# Patient Record
Sex: Female | Born: 1955 | Race: Black or African American | Hispanic: No | Smoking: Never smoker
Health system: Southern US, Community
[De-identification: ages and names within clinical notes are randomized; demographics above are authoritative.]

## PROBLEM LIST (undated history)

## (undated) DIAGNOSIS — D0510 Intraductal carcinoma in situ of unspecified breast: Secondary | ICD-10-CM

## (undated) DIAGNOSIS — E7800 Pure hypercholesterolemia, unspecified: Secondary | ICD-10-CM

## (undated) HISTORY — PX: TUBAL LIGATION: SHX77

---

## 2015-12-21 ENCOUNTER — Ambulatory Visit
Admission: RE | Admit: 2015-12-21 | Discharge: 2015-12-21 | Disposition: A | Discharge status: Home / Self Care | Payer: Self-pay | Source: Ambulatory Visit | Attending: Oncology | Admitting: Oncology

## 2015-12-21 ENCOUNTER — Other Ambulatory Visit: Payer: Self-pay | Admitting: Oncology

## 2015-12-21 ENCOUNTER — Ambulatory Visit: Payer: Self-pay | Attending: Oncology

## 2015-12-21 VITALS — BP 156/87 | HR 74 | Temp 95.3°F | Resp 18 | Ht 68.11 in | Wt 170.0 lb

## 2015-12-21 DIAGNOSIS — Z0000 Encounter for general adult medical examination without abnormal findings: Secondary | ICD-10-CM

## 2015-12-21 NOTE — Progress Notes (Signed)
Subjective:     Patient ID: Erica Perkins, female   DOB: 01-25-1956, 60 y.o.   MRN: [id number]  HPI   Review of Systems     Objective:   Physical Exam  Pulmonary/Chest: Right breast exhibits no inverted nipple, no mass, no nipple discharge, no skin change and no tenderness. Left breast exhibits no inverted nipple, no mass, no nipple discharge, no skin change and no tenderness. Breasts are symmetrical.  Genitourinary: No labial fusion. There is no rash, tenderness, lesion or injury on the right labia. There is no rash, tenderness, lesion or injury on the left labia. Uterus is not deviated, not enlarged, not fixed and not tender. Cervix exhibits no motion tenderness, no discharge and no friability. No erythema, tenderness or bleeding in the vagina. No foreign body around the vagina. No signs of injury around the vagina. No vaginal discharge found.       Assessment:     60 year old female patient presents for [facility] clinic visit.  Patient screened, and meets BCCCP eligibility.  Patient does not have insurance, Medicare or Medicaid.  Handout given on Affordable Care Act.  Instructed patient on breast self-exam using teach back method.  CBE unremarkable. No mass or lump palpated. Pelvic exam normal.     Plan:  Sent for bilateral screening mammogram.  Specimen collected for pap.

## 2015-12-23 ENCOUNTER — Ambulatory Visit: Payer: Self-pay

## 2015-12-23 LAB — PAP LB AND HPV HIGH-RISK
HPV, HIGH-RISK: NEGATIVE
PAP Smear Comment: 0

## 2015-12-25 ENCOUNTER — Other Ambulatory Visit: Payer: Self-pay | Admitting: *Deleted

## 2015-12-25 DIAGNOSIS — N63 Unspecified lump in unspecified breast: Secondary | ICD-10-CM

## 2015-12-31 NOTE — Progress Notes (Signed)
Pap results normal with negative HPV.  Next pap due in 5 years.  Patient is to be scheduled for additional views for Birads 0 mammogram results from 12/21/15. Orders put in by Tanya Nones RN.  Per radiologist's request patient is scheduled for a 6 month follow-up mammogram and ultrasound of left breast asymmetry, and a right breast ultrasound on July 28, 2016 at 11:20 a.m.  Letter mailed to notify patient of appointment.

## 2016-01-14 ENCOUNTER — Ambulatory Visit
Admission: RE | Admit: 2016-01-14 | Discharge: 2016-01-14 | Disposition: A | Discharge status: Home / Self Care | Payer: Self-pay | Source: Ambulatory Visit | Attending: Oncology | Admitting: Oncology

## 2016-01-14 ENCOUNTER — Other Ambulatory Visit: Payer: Self-pay

## 2016-01-14 ENCOUNTER — Other Ambulatory Visit: Payer: Self-pay | Admitting: Oncology

## 2016-01-14 DIAGNOSIS — N63 Unspecified lump in unspecified breast: Secondary | ICD-10-CM

## 2016-01-14 DIAGNOSIS — R928 Other abnormal and inconclusive findings on diagnostic imaging of breast: Secondary | ICD-10-CM

## 2016-01-26 ENCOUNTER — Other Ambulatory Visit: Payer: Self-pay | Admitting: Oncology

## 2016-01-26 ENCOUNTER — Ambulatory Visit
Admission: RE | Admit: 2016-01-26 | Discharge: 2016-01-26 | Disposition: A | Discharge status: Home / Self Care | Payer: Self-pay | Source: Ambulatory Visit | Attending: Oncology | Admitting: Oncology

## 2016-01-26 DIAGNOSIS — D241 Benign neoplasm of right breast: Secondary | ICD-10-CM | POA: Insufficient documentation

## 2016-01-26 DIAGNOSIS — R928 Other abnormal and inconclusive findings on diagnostic imaging of breast: Secondary | ICD-10-CM

## 2016-01-26 DIAGNOSIS — N63 Unspecified lump in unspecified breast: Secondary | ICD-10-CM

## 2016-01-26 HISTORY — PX: BREAST BIOPSY: SHX20

## 2016-01-27 ENCOUNTER — Telehealth: Payer: Self-pay

## 2016-01-27 LAB — SURGICAL PATHOLOGY

## 2016-01-27 NOTE — Telephone Encounter (Signed)
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  Also gave her negative pap results with negative HPV.  Next pap due in 5 years.  Will schedule 6 month follow-up mammogram.  Patient requests a Tuesday morning appointment.

## 2016-02-09 ENCOUNTER — Other Ambulatory Visit: Payer: Self-pay

## 2016-02-09 DIAGNOSIS — N63 Unspecified lump in unspecified breast: Secondary | ICD-10-CM

## 2016-07-28 ENCOUNTER — Ambulatory Visit: Payer: Self-pay

## 2016-07-28 ENCOUNTER — Other Ambulatory Visit: Payer: Self-pay

## 2016-08-02 ENCOUNTER — Ambulatory Visit
Admission: RE | Admit: 2016-08-02 | Discharge: 2016-08-02 | Disposition: A | Discharge status: Home / Self Care | Payer: Self-pay | Source: Ambulatory Visit | Attending: Oncology | Admitting: Oncology

## 2016-08-02 DIAGNOSIS — N63 Unspecified lump in unspecified breast: Secondary | ICD-10-CM

## 2016-09-05 ENCOUNTER — Encounter: Payer: Self-pay | Admitting: Gastroenterology

## 2016-10-27 ENCOUNTER — Ambulatory Visit (AMBULATORY_SURGERY_CENTER): Payer: Self-pay | Admitting: *Deleted

## 2016-10-27 ENCOUNTER — Encounter (INDEPENDENT_AMBULATORY_CARE_PROVIDER_SITE_OTHER): Payer: Self-pay

## 2016-10-27 DIAGNOSIS — Z1211 Encounter for screening for malignant neoplasm of colon: Secondary | ICD-10-CM

## 2016-10-27 MED ORDER — NA SULFATE-K SULFATE-MG SULF 17.5-3.13-1.6 GM/177ML PO SOLN: ORAL | 0 refills | Status: DC

## 2016-10-27 NOTE — Progress Notes (Signed)
Pt denies allergies to eggs or soy products. Denies difficulty with sedation or anesthesia. Denies any diet or weight loss medications. Denies use of supplemental oxygen.  Emmi instructions given for procedure.  

## 2016-11-10 ENCOUNTER — Ambulatory Visit (AMBULATORY_SURGERY_CENTER): Payer: Self-pay | Admitting: Gastroenterology

## 2016-11-10 ENCOUNTER — Encounter: Payer: Self-pay | Admitting: Gastroenterology

## 2016-11-10 VITALS — BP 134/91 | HR 78 | Temp 97.5°F | Resp 18 | Ht 68.0 in | Wt 169.0 lb

## 2016-11-10 DIAGNOSIS — Z1211 Encounter for screening for malignant neoplasm of colon: Secondary | ICD-10-CM

## 2016-11-10 DIAGNOSIS — Z1212 Encounter for screening for malignant neoplasm of rectum: Secondary | ICD-10-CM

## 2016-11-10 MED ORDER — SODIUM CHLORIDE 0.9 % IV SOLN: 500.0000 mL | INTRAVENOUS | Status: DC

## 2016-11-10 NOTE — Op Note (Signed)
[facility] Patient Name: Erica Perkins Procedure Date: 11/10/2016 10:43 AM MRN: [id number] Endoscopist: Mauri Pole , MD Age: 61 Referring MD:  Date of Birth: 08-11-1956 Gender: Female Account #: [id number] Procedure:                Colonoscopy Indications:              Screening for colorectal malignant neoplasm, This                            is the patient's first colonoscopy Medicines:                Monitored Anesthesia Care Procedure:                Pre-Anesthesia Assessment:                           - Prior to the procedure, a History and Physical                            was performed, and patient medications and                            allergies were reviewed. The patient's tolerance of                            previous anesthesia was also reviewed. The risks                            and benefits of the procedure and the sedation                            options and risks were discussed with the patient.                            All questions were answered, and informed consent                            was obtained. Prior Anticoagulants: The patient has                            taken no previous anticoagulant or antiplatelet                            agents. ASA Grade Assessment: II - A patient with                            mild systemic disease. After reviewing the risks                            and benefits, the patient was deemed in                            satisfactory condition to undergo the procedure.  After obtaining informed consent, the colonoscope                            was passed under direct vision. Throughout the                            procedure, the patient's blood pressure, pulse, and                            oxygen saturations were monitored continuously. The                            Model CF-HQ190L [id number]) scope was introduced                            through the  anus and advanced to the the terminal                            ileum, with identification of the appendiceal                            orifice and IC valve. The colonoscopy was performed                            without difficulty. The patient tolerated the                            procedure well. The quality of the bowel                            preparation was excellent. The terminal ileum,                            ileocecal valve, appendiceal orifice, and rectum                            were photographed. Scope In: 10:48:22 AM Scope Out: 11:00:37 AM Scope Withdrawal Time: 0 hours 8 minutes 50 seconds  Total Procedure Duration: 0 hours 12 minutes 15 seconds  Findings:                 The perianal and digital rectal examinations were                            normal.                           Non-bleeding internal hemorrhoids were found during                            retroflexion. The hemorrhoids were small.                           The exam was otherwise without abnormality.  There was a medium-sized lipoma, 15 mm in diameter,                            in the ascending colon. Complications:            No immediate complications. Estimated Blood Loss:     Estimated blood loss: none. Estimated blood loss:                            none. Impression:               - Non-bleeding internal hemorrhoids.                           - The examination was otherwise normal.                           - Medium-sized lipoma in the ascending colon.                           - No specimens collected. Recommendation:           - Patient has a contact number available for                            emergencies. The signs and symptoms of potential                            delayed complications were discussed with the                            patient. Return to normal activities tomorrow.                            Written discharge instructions were  provided to the                            patient.                           - Resume previous diet.                           - Continue present medications.                           - Repeat colonoscopy in 10 years for screening                            purposes.                           - Return to GI clinic PRN. Mauri Pole, MD 11/10/2016 11:18:52 AM This report has been signed electronically.

## 2016-11-10 NOTE — Progress Notes (Signed)
To recovery, report to Sechler, RN, VSS 

## 2016-11-10 NOTE — Patient Instructions (Signed)
Impression/Recommendations:  Hemorrhoid handout given to patient.  Repeat colonoscopy in 10 years for screening purposes.  YOU HAD AN ENDOSCOPIC PROCEDURE TODAY AT THE [facility]:   Refer to the procedure report that was given to you for any specific questions about what was found during the examination.  If the procedure report does not answer your questions, please call your gastroenterologist to clarify.  If you requested that your care partner not be given the details of your procedure findings, then the procedure report has been included in a sealed envelope for you to review at your convenience later.  YOU SHOULD EXPECT: Some feelings of bloating in the abdomen. Passage of more gas than usual.  Walking can help get rid of the air that was put into your GI tract during the procedure and reduce the bloating. If you had a lower endoscopy (such as a colonoscopy or flexible sigmoidoscopy) you may notice spotting of blood in your stool or on the toilet paper. If you underwent a bowel prep for your procedure, you may not have a normal bowel movement for a few days.  Please Note:  You might notice some irritation and congestion in your nose or some drainage.  This is from the oxygen used during your procedure.  There is no need for concern and it should clear up in a day or so.  SYMPTOMS TO REPORT IMMEDIATELY:   Following lower endoscopy (colonoscopy or flexible sigmoidoscopy):  Excessive amounts of blood in the stool  Significant tenderness or worsening of abdominal pains  Swelling of the abdomen that is new, acute  Fever of 100F or higher  For urgent or emergent issues, a gastroenterologist can be reached at any hour by calling [phone].   DIET:  We do recommend a small meal at first, but then you may proceed to your regular diet.  Drink plenty of fluids but you should avoid alcoholic beverages for 24 hours.  ACTIVITY:  You should plan to take it easy for the rest of  today and you should NOT DRIVE or use heavy machinery until tomorrow (because of the sedation medicines used during the test).    FOLLOW UP: Our staff will call the number listed on your records the next business day following your procedure to check on you and address any questions or concerns that you may have regarding the information given to you following your procedure. If we do not reach you, we will leave a message.  However, if you are feeling well and you are not experiencing any problems, there is no need to return our call.  We will assume that you have returned to your regular daily activities without incident.  If any biopsies were taken you will be contacted by phone or by letter within the next 1-3 weeks.  Please call us at [phone] if you have not heard about the biopsies in 3 weeks.    SIGNATURES/CONFIDENTIALITY: You and/or your care partner have signed paperwork which will be entered into your electronic medical record.  These signatures attest to the fact that that the information above on your After Visit Summary has been reviewed and is understood.  Full responsibility of the confidentiality of this discharge information lies with you and/or your care-partner. 

## 2016-11-11 ENCOUNTER — Telehealth: Payer: Self-pay | Admitting: *Deleted

## 2016-11-11 NOTE — Telephone Encounter (Signed)
  Follow up Call-  Call back number 11/10/2016  Post procedure Call Back phone  # [phone]  Permission to leave phone message Yes     Patient questions:  Do you have a fever, pain , or abdominal swelling? No. Pain Score  0 *  Have you tolerated food without any problems? Yes.    Have you been able to return to your normal activities? Yes.    Do you have any questions about your discharge instructions: Diet   No. Medications  No. Follow up visit  No.  Do you have questions or concerns about your Care? no  Actions: * If pain score is 4 or above: No action needed, pain <4.

## 2017-01-25 ENCOUNTER — Ambulatory Visit: Payer: Self-pay

## 2017-01-30 ENCOUNTER — Ambulatory Visit
Admission: RE | Admit: 2017-01-30 | Discharge: 2017-01-30 | Disposition: A | Discharge status: Home / Self Care | Payer: Self-pay | Source: Ambulatory Visit | Attending: Oncology | Admitting: Oncology

## 2017-01-30 ENCOUNTER — Ambulatory Visit: Payer: Self-pay

## 2017-01-30 ENCOUNTER — Ambulatory Visit: Payer: Self-pay | Attending: Oncology

## 2017-01-30 ENCOUNTER — Encounter (INDEPENDENT_AMBULATORY_CARE_PROVIDER_SITE_OTHER): Payer: Self-pay

## 2017-01-30 VITALS — BP 150/78 | HR 73 | Temp 98.1°F | Ht 67.0 in | Wt 157.3 lb

## 2017-01-30 DIAGNOSIS — N63 Unspecified lump in unspecified breast: Secondary | ICD-10-CM

## 2017-01-30 NOTE — Progress Notes (Signed)
Subjective:     Patient ID: Erica Perkins, female   DOB: [birth year], 61 y.o.   MRN: [id number]  HPI   Review of Systems     Objective:   Physical Exam  Pulmonary/Chest: Right breast exhibits no inverted nipple, no mass, no nipple discharge, no skin change and no tenderness. Left breast exhibits no inverted nipple, no mass, no nipple discharge, no skin change and no tenderness. Breasts are symmetrical.       Assessment:     61 year old patient returns for annual BCCCP screening.  She had a benign right breast biopsy in April 2017.  Following patient's 6 month follow-up mammogram in October 2017, radiologist requested  6 month follow-up diagnostic mammogram, and left breast ultrasound follow-up left breast asymmetry.  Patient screened, and meets BCCCP eligibility.  Patient does not have insurance, Medicare or Medicaid.  Handout given on Affordable Care Act.  Instructed patient on breast self-exam using teach back method.  CBE unremarkable. No mass or lump palpated.    Plan:     Sent for bilateral diagnostic mammogram, and ultrasound.

## 2017-03-10 NOTE — Progress Notes (Signed)
Patient phoned to state she received BCCCP appointment information.  Appointment scheduled 03/14/18 at 12:30.  Patient will need diagnostic mammogram and ultrasound as follow-up for Birads 3 results.  Copy to HSIS.

## 2018-03-14 ENCOUNTER — Ambulatory Visit: Payer: Self-pay | Attending: Oncology

## 2018-03-14 ENCOUNTER — Other Ambulatory Visit: Payer: Self-pay

## 2018-03-14 ENCOUNTER — Encounter (INDEPENDENT_AMBULATORY_CARE_PROVIDER_SITE_OTHER): Payer: Self-pay

## 2018-03-14 ENCOUNTER — Ambulatory Visit
Admission: RE | Admit: 2018-03-14 | Discharge: 2018-03-14 | Disposition: A | Discharge status: Home / Self Care | Payer: Self-pay | Source: Ambulatory Visit | Attending: Oncology | Admitting: Oncology

## 2018-03-14 VITALS — BP 169/91 | HR 73 | Temp 98.6°F | Ht 68.0 in | Wt 150.0 lb

## 2018-03-14 DIAGNOSIS — N63 Unspecified lump in unspecified breast: Secondary | ICD-10-CM

## 2018-03-14 NOTE — Progress Notes (Signed)
  Subjective:     Patient ID: Erica Perkins, female   DOB: April 17, 1956, 62 y.o.   MRN: [id number]  HPI   Review of Systems     Objective:   Physical Exam  Pulmonary/Chest: Right breast exhibits no inverted nipple, no mass, no nipple discharge, no skin change and no tenderness. Left breast exhibits no inverted nipple, no mass, no nipple discharge, no skin change and no tenderness. Breasts are symmetrical.       Assessment:     62 year old patient returns for [place] clinic visit, and follow-up Birads 3 mammogram results.  Patient had a benign biopsy right breast in December 2018, and left breast asymmetry finding.   Patient screened, and meets BCCCP eligibility.  Patient does not have insurance, Medicare or Medicaid.  Handout given on Affordable Care Act.Instructed patient on breast self awareness using teach back method.  Clinical breast exam unremarkable.  No mass or lump palpated.  Patient lives in [place], and needs assist with transportation.  Gas voucher  Given through the [employer].    Plan:     Sent for bilateral diagnostic mammogram, and ultrasound.

## 2018-03-14 NOTE — Progress Notes (Signed)
Letter mailed from [facility] to notify of normal mammogram results.  Patient to return in one year for annual screening.  Copy to HSIS. 

## 2019-05-08 ENCOUNTER — Telehealth: Payer: Self-pay

## 2019-05-08 NOTE — Telephone Encounter (Signed)
NOTES FROM [place] DNP [phone], NOT [facility] Masse

## 2019-05-08 NOTE — Telephone Encounter (Signed)
NOTES FROM Athena Masse PA [phone] SENT REFERRAL TO [place]

## 2019-07-03 ENCOUNTER — Ambulatory Visit: Payer: Self-pay | Admitting: Cardiology

## 2019-08-19 ENCOUNTER — Telehealth: Payer: Self-pay

## 2019-08-19 NOTE — Telephone Encounter (Signed)
Pre-visit BCCCP call for 08/21/2019 appointment. No answer - left msg.

## 2019-08-21 ENCOUNTER — Encounter: Payer: Self-pay | Admitting: *Deleted

## 2019-08-21 ENCOUNTER — Ambulatory Visit: Payer: Self-pay | Attending: Oncology | Admitting: *Deleted

## 2019-08-21 ENCOUNTER — Other Ambulatory Visit: Payer: Self-pay

## 2019-08-21 ENCOUNTER — Ambulatory Visit
Admission: RE | Admit: 2019-08-21 | Discharge: 2019-08-21 | Disposition: A | Discharge status: Home / Self Care | Payer: Self-pay | Source: Ambulatory Visit | Attending: Oncology | Admitting: Oncology

## 2019-08-21 VITALS — BP 144/79 | HR 79 | Temp 97.5°F | Resp 16 | Ht 68.0 in | Wt 142.0 lb

## 2019-08-21 DIAGNOSIS — Z0000 Encounter for general adult medical examination without abnormal findings: Secondary | ICD-10-CM | POA: Insufficient documentation

## 2019-08-21 NOTE — Progress Notes (Signed)
  Subjective:     Patient ID: Erica Perkins, female   DOB: Jul 24, 1956, 63 y.o.   MRN: [id number]  HPI   Review of Systems     Objective:   Physical Exam Chest:     Breasts:        Right: No swelling, bleeding, inverted nipple, mass, nipple discharge, skin change or tenderness.        Left: No swelling, bleeding, inverted nipple, mass, nipple discharge, skin change or tenderness.    Lymphadenopathy:     Upper Body:     Right upper body: No supraclavicular or axillary adenopathy.     Left upper body: No supraclavicular or axillary adenopathy.        Assessment:     63 year old female returns to [facility] for annual screening.  Clinical breast exam reveals an approximate 1 cm hard, mobile superficial like cyst in the left axilla.   Patient states it has been present for about 3 weeks.  States she does get some white pus from it. Encouraged patient to follow up with her primary care provider if it persist or shows signs of infection.  She is agreeable.  Taught self breast awareness.  Last pap on 12/22/15 was negative / negative.  Next pap due in 2022.  Patient has been screened for eligibility.  She does not have any insurance, Medicare or Medicaid.  She also meets financial eligibility.  Hand-out given on the Affordable Care Act. Risk Assessment    Risk Scores      08/21/2019 03/14/2018   Last edited by: Theodore Demark, RN Theodore Demark, RN   5-year risk: 2 % 1.9 %   Lifetime risk: 7.5 % 8 %            Plan:     Screening mammogram ordered.  Will follow up per BCCCP protocol.

## 2019-08-21 NOTE — Patient Instructions (Signed)
Gave patient hand-out, Women Staying Healthy, Active and Well from B[facility]CP, with education on breast health, pap smears, heart and colon health. 

## 2019-08-22 ENCOUNTER — Encounter: Payer: Self-pay | Admitting: *Deleted

## 2019-08-22 NOTE — Progress Notes (Signed)
Letter mailed from the [facility] to inform patient of her normal mammogram results.  Patient is to follow-up with annual screening in one year.  HSIS to [place]. 

## 2019-12-23 ENCOUNTER — Emergency Department
Admission: EM | Admit: 2019-12-23 | Discharge: 2019-12-23 | Disposition: A | Discharge status: Home / Self Care | Payer: Self-pay | Attending: Emergency Medicine | Admitting: Emergency Medicine

## 2019-12-23 ENCOUNTER — Encounter: Payer: Self-pay | Admitting: Emergency Medicine

## 2019-12-23 ENCOUNTER — Other Ambulatory Visit: Payer: Self-pay

## 2019-12-23 DIAGNOSIS — E875 Hyperkalemia: Secondary | ICD-10-CM | POA: Insufficient documentation

## 2019-12-23 DIAGNOSIS — Z711 Person with feared health complaint in whom no diagnosis is made: Secondary | ICD-10-CM | POA: Insufficient documentation

## 2019-12-23 DIAGNOSIS — Z5321 Procedure and treatment not carried out due to patient leaving prior to being seen by health care provider: Secondary | ICD-10-CM | POA: Insufficient documentation

## 2019-12-23 LAB — COMPREHENSIVE METABOLIC PANEL
ALT: 12 U/L (ref 0–44)
AST: 13 U/L — ABNORMAL LOW (ref 15–41)
Albumin: 4.5 g/dL (ref 3.5–5.0)
Alkaline Phosphatase: 61 U/L (ref 38–126)
Anion gap: 8 (ref 5–15)
BUN: 15 mg/dL (ref 8–23)
CO2: 30 mmol/L (ref 22–32)
Calcium: 9.5 mg/dL (ref 8.9–10.3)
Chloride: 103 mmol/L (ref 98–111)
Creatinine, Ser: 0.51 mg/dL (ref 0.44–1.00)
GFR calc Af Amer: >60 mL/min (ref >60)
GFR calc non Af Amer: >60 mL/min (ref >60)
Glucose, Bld: 102 mg/dL — ABNORMAL HIGH (ref 70–99)
Potassium: 4.8 mmol/L (ref 3.5–5.1)
Sodium: 141 mmol/L (ref 135–145)
Total Bilirubin: 0.6 mg/dL (ref 0.3–1.2)
Total Protein: 7.9 g/dL (ref 6.5–8.1)

## 2019-12-23 LAB — CBC WITH DIFFERENTIAL/PLATELET
Abs Immature Granulocytes: 0.02 10*3/uL (ref 0.00–0.07)
Basophils Absolute: 0 10*3/uL (ref 0.0–0.1)
Basophils Relative: 0 %
Eosinophils Absolute: 0.1 10*3/uL (ref 0.0–0.5)
Eosinophils Relative: 1 %
HCT: 37.9 % (ref 36.0–46.0)
Hemoglobin: 11.9 g/dL — ABNORMAL LOW (ref 12.0–15.0)
Immature Granulocytes: 0 %
Lymphocytes Relative: 35 %
Lymphs Abs: 2.5 10*3/uL (ref 0.7–4.0)
MCH: 27 pg (ref 26.0–34.0)
MCHC: 31.4 g/dL (ref 30.0–36.0)
MCV: 86.1 fL (ref 80.0–100.0)
Monocytes Absolute: 0.8 10*3/uL (ref 0.1–1.0)
Monocytes Relative: 11 %
Neutro Abs: 3.9 10*3/uL (ref 1.7–7.7)
Neutrophils Relative %: 53 %
Platelets: 326 10*3/uL (ref 150–400)
RBC: 4.4 MIL/uL (ref 3.87–5.11)
RDW: 14.9 % (ref 11.5–15.5)
WBC: 7.3 10*3/uL (ref 4.0–10.5)
nRBC: 0 % (ref 0.0–0.2)

## 2019-12-23 NOTE — ED Triage Notes (Signed)
Pt reports to ED from PCP for "critical K+" Pt denies chest pain, SHOB, dizziness, weakness, pain  Reports that she feels normal

## 2019-12-23 NOTE — ED Notes (Signed)
No answer when called several times from lobby; called pt on phone and st had already left

## 2019-12-23 NOTE — ED Triage Notes (Addendum)
Pt presents to ED with elevated K+ level after having blood drawn with pcp. Pt was told K+ was 5.9 and to come to ED immediately. Pt came to ED a few hours ago but had to leave and take her family home and just back. Denies any symptoms currently.

## 2019-12-24 ENCOUNTER — Emergency Department
Admission: EM | Admit: 2019-12-24 | Discharge: 2019-12-24 | Disposition: A | Discharge status: Home / Self Care | Payer: Self-pay | Attending: Emergency Medicine | Admitting: Emergency Medicine

## 2019-12-24 DIAGNOSIS — Z0000 Encounter for general adult medical examination without abnormal findings: Secondary | ICD-10-CM

## 2019-12-24 NOTE — Discharge Instructions (Addendum)
As we discussed, it is likely that the abnormal potassium level found at your doctor's office was the result of what is called hemolysis, which presents as a falsely high potassium level.  Fortunately when we rechecked your lab work tonight in the emergency department all of your labs were within normal limits including your potassium level.  Please follow-up with your regular doctor and let him or her know that your potassium was fine upon recheck.  Keep up the good work with your physical fitness!  Return to the emergency department if you develop new or worsening symptoms that concern you.

## 2019-12-24 NOTE — ED Provider Notes (Signed)
[facility] Emergency Department Provider Note  ____________________________________________   First MD Initiated Contact with Patient 12/24/19 [phone]     (approximate)  I have reviewed the triage vital signs and the nursing notes.   HISTORY  Chief Complaint Abnormal Lab    HPI Erica Perkins is a 64 y.o. female who presents for evaluation of possible abnormal lab.  She had a normal visit to her PCP within the last 1 to 2 days for routine maintenance and lab work.  She got a call this morning that told her her potassium was elevated at 5.9 and she needed to emergently go to the emergency department.  She has no symptoms.  She denies chest pain, palpitations, sore throat, shortness of breath, nausea, vomiting, and abdominal pain.  She reports that she is extremely healthy and active, works out 4 hours in the morning and 4 hours in the evening including 500 sit ups each time, has never had any issues with her blood work other than occasional elevated cholesterol, and takes no medications.  She has been urinating normally.  She has no concerns about her health.         History reviewed. No pertinent past medical history.  There are no problems to display for this patient.   Past Surgical History:  Procedure Laterality Date  . BREAST BIOPSY Right 01/26/2016   u/s core/ neg FIBROADENOMA    Prior to Admission medications   Not on File    Allergies Patient has no known allergies.  Family History  Problem Relation Age of Onset  . Breast cancer Sister        35's  . Breast cancer Maternal Aunt   . Colon cancer Neg Hx     Social History Social History   Tobacco Use  . Smoking status: Never Smoker  . Smokeless tobacco: Never Used  Substance Use Topics  . Alcohol use: Not on file    Comment: rarely  . Drug use: No    Review of Systems Constitutional: No fever/chills Eyes: No visual changes. ENT: No sore throat. Cardiovascular:  Denies chest pain.  Denies palpitations. Respiratory: Denies shortness of breath. Gastrointestinal: No abdominal pain.  No nausea, no vomiting.  No diarrhea.  No constipation. Genitourinary: Negative for dysuria. Neurological: Negative for headaches, focal weakness or numbness.   ____________________________________________   PHYSICAL EXAM:  VITAL SIGNS: ED Triage Vitals  Enc Vitals Group     BP 12/23/19 2227 (!) 170/74     Pulse Rate 12/23/19 2227 80     Resp 12/23/19 2227 18     Temp 12/23/19 2227 98.5 F (36.9 C)     Temp Source 12/23/19 2227 Oral     SpO2 12/23/19 2227 100 %     Weight 12/23/19 2230 69.4 kg (153 lb)     Height 12/23/19 2230 1.727 m (5\' 8" )     Head Circumference --      Peak Flow --      Pain Score 12/23/19 2230 0     Pain Loc --      Pain Edu? --      Excl. in [place]? --     Constitutional: Alert and oriented.  Patient appears quite healthy and active and in no distress. Eyes: Conjunctivae are normal.  Head: Atraumatic. Mouth/Throat: Patient is wearing a mask. Neck: No stridor.  No meningeal signs.   Cardiovascular: Normal rate, regular rhythm. Good peripheral circulation.  Respiratory: Normal respiratory effort.  No retractions. Musculoskeletal:  No lower extremity tenderness nor edema. No gross deformities of extremities. Neurologic:  Normal speech and language. No gross focal neurologic deficits are appreciated.  Skin:  Skin is warm, dry and intact. Psychiatric: Mood and affect are normal. Speech and behavior are normal.  ____________________________________________   LABS (all labs ordered are listed, but only abnormal results are displayed)  Labs Reviewed - No data to display ____________________________________________  EKG  No indication for emergent EKG ____________________________________________  RADIOLOGY I, Erica Perkins, personally viewed and evaluated these images (plain radiographs) as part of my medical decision making, as well  as reviewing the written report by the radiologist.  ED MD interpretation: No indication for emergent imaging  Official radiology report(s): No results found.  ____________________________________________   PROCEDURES   Procedure(s) performed (including Critical Care):  Procedures   ____________________________________________   INITIAL IMPRESSION / MDM / [place] / ED COURSE  As part of my medical decision making, I reviewed the following data within the [facility] notes reviewed and incorporated, Labs reviewed , Old chart reviewed and Notes from prior ED visits   The patient's potassium was elevated at her PCPs office but I suspect this was due to hemolysis.  We will recheck lab work tonight and all of her labs were within normal limits including a potassium of 4.8, normal CBC, otherwise normal CMP.  She is asymptomatic and very healthy and active.  I explained to the patient about the probability of hemolysis and falsely elevated potassium.  She is very comfortable with going home and following up as an outpatient.  I gave my usual and customary return precautions.          ____________________________________________  FINAL CLINICAL IMPRESSION(S) / ED DIAGNOSES  Final diagnoses:  Normal exam     MEDICATIONS GIVEN DURING THIS VISIT:  Medications - No data to display   ED Discharge Orders    None      *Please note:  Erica Perkins was evaluated in Emergency Department on 12/24/2019 for the symptoms described in the history of present illness. She was evaluated in the context of the global COVID-19 pandemic, which necessitated consideration that the patient might be at risk for infection with the SARS-CoV-2 virus that causes COVID-19. Institutional protocols and algorithms that pertain to the evaluation of patients at risk for COVID-19 are in a state of rapid change based on information released by regulatory bodies  including the CDC and federal and state organizations. These policies and algorithms were followed during the patient's care in the ED.  Some ED evaluations and interventions may be delayed as a result of limited staffing during the pandemic.*  Note:  This document was prepared using Dragon voice recognition software and may include unintentional dictation errors.   Erica Kehr, MD 12/24/19 [phone]

## 2020-07-15 ENCOUNTER — Other Ambulatory Visit: Payer: Self-pay | Admitting: Family Medicine

## 2020-07-15 ENCOUNTER — Other Ambulatory Visit: Payer: Self-pay | Admitting: Internal Medicine

## 2020-07-15 DIAGNOSIS — Z1231 Encounter for screening mammogram for malignant neoplasm of breast: Secondary | ICD-10-CM

## 2020-08-20 ENCOUNTER — Other Ambulatory Visit: Payer: Self-pay

## 2020-08-20 ENCOUNTER — Ambulatory Visit
Admission: RE | Admit: 2020-08-20 | Discharge: 2020-08-20 | Disposition: A | Discharge status: Home / Self Care | Payer: 59 | Source: Ambulatory Visit | Attending: Internal Medicine | Admitting: Internal Medicine

## 2020-08-20 DIAGNOSIS — Z1231 Encounter for screening mammogram for malignant neoplasm of breast: Secondary | ICD-10-CM

## 2020-08-31 ENCOUNTER — Other Ambulatory Visit: Payer: Self-pay | Admitting: Internal Medicine

## 2020-08-31 DIAGNOSIS — R921 Mammographic calcification found on diagnostic imaging of breast: Secondary | ICD-10-CM

## 2020-08-31 DIAGNOSIS — R928 Other abnormal and inconclusive findings on diagnostic imaging of breast: Secondary | ICD-10-CM

## 2020-09-14 ENCOUNTER — Ambulatory Visit
Admission: RE | Admit: 2020-09-14 | Discharge: 2020-09-14 | Disposition: A | Discharge status: Home / Self Care | Payer: 59 | Source: Ambulatory Visit | Attending: Internal Medicine | Admitting: Internal Medicine

## 2020-09-14 ENCOUNTER — Other Ambulatory Visit: Payer: Self-pay

## 2020-09-14 DIAGNOSIS — R921 Mammographic calcification found on diagnostic imaging of breast: Secondary | ICD-10-CM | POA: Diagnosis present

## 2020-09-14 DIAGNOSIS — R928 Other abnormal and inconclusive findings on diagnostic imaging of breast: Secondary | ICD-10-CM | POA: Diagnosis present

## 2020-09-18 ENCOUNTER — Other Ambulatory Visit: Payer: Self-pay | Admitting: Internal Medicine

## 2020-09-18 DIAGNOSIS — R921 Mammographic calcification found on diagnostic imaging of breast: Secondary | ICD-10-CM

## 2020-09-18 DIAGNOSIS — R928 Other abnormal and inconclusive findings on diagnostic imaging of breast: Secondary | ICD-10-CM

## 2021-03-18 ENCOUNTER — Inpatient Hospital Stay: Admission: RE | Admit: 2021-03-18 | Payer: 59 | Source: Ambulatory Visit

## 2021-04-01 ENCOUNTER — Other Ambulatory Visit: Payer: Self-pay

## 2021-04-01 ENCOUNTER — Ambulatory Visit
Admission: RE | Admit: 2021-04-01 | Discharge: 2021-04-01 | Disposition: A | Discharge status: Home / Self Care | Payer: 59 | Source: Ambulatory Visit | Attending: Internal Medicine | Admitting: Internal Medicine

## 2021-04-01 DIAGNOSIS — R928 Other abnormal and inconclusive findings on diagnostic imaging of breast: Secondary | ICD-10-CM | POA: Diagnosis not present

## 2021-04-01 DIAGNOSIS — R921 Mammographic calcification found on diagnostic imaging of breast: Secondary | ICD-10-CM | POA: Insufficient documentation

## 2021-08-31 ENCOUNTER — Other Ambulatory Visit: Payer: Self-pay | Admitting: Internal Medicine

## 2021-08-31 DIAGNOSIS — R921 Mammographic calcification found on diagnostic imaging of breast: Secondary | ICD-10-CM

## 2021-11-23 ENCOUNTER — Ambulatory Visit
Admission: RE | Admit: 2021-11-23 | Discharge: 2021-11-23 | Disposition: A | Discharge status: Home / Self Care | Payer: 59 | Source: Ambulatory Visit | Attending: Internal Medicine | Admitting: Internal Medicine

## 2021-11-23 ENCOUNTER — Other Ambulatory Visit: Payer: Self-pay

## 2021-11-23 DIAGNOSIS — R921 Mammographic calcification found on diagnostic imaging of breast: Secondary | ICD-10-CM | POA: Diagnosis not present

## 2022-03-18 IMAGING — MG DIGITAL DIAGNOSTIC BILAT W/ TOMO W/ CAD
8 of 12 series · 9 of 32 positions shown · non-contrast
Comparison: Are films

CLINICAL DATA: Short-term follow-up left breast calcifications

EXAM:
DIGITAL DIAGNOSTIC BILATERAL MAMMOGRAM WITH TOMOSYNTHESIS AND CAD
TECHNIQUE: Bilateral digital diagnostic mammography and breast tomosynthesis
was performed. The images were evaluated with computer-aided
detection.

[L MLO (1 of 2)]
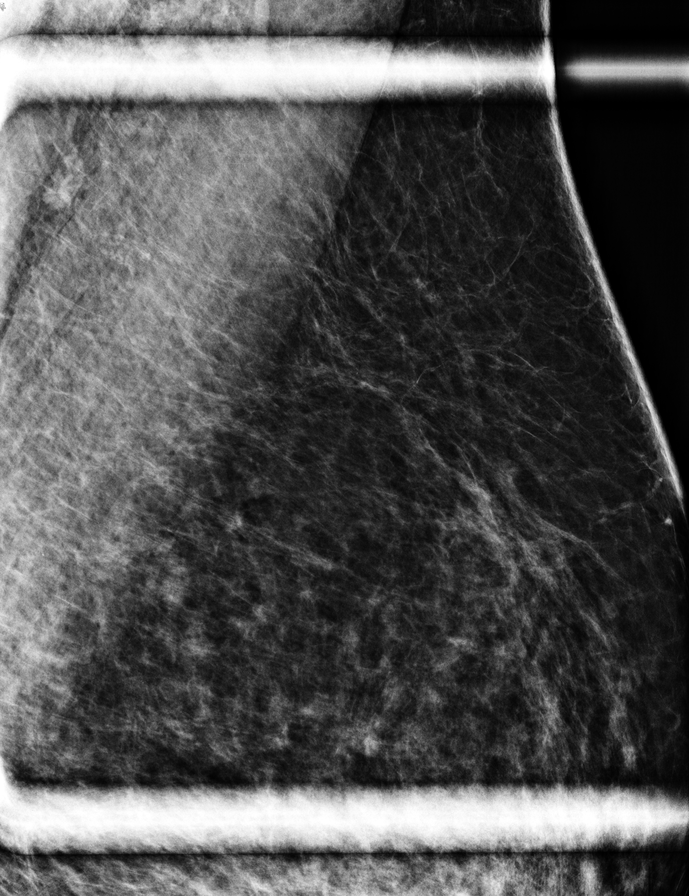

[L MLO (2 of 2)]
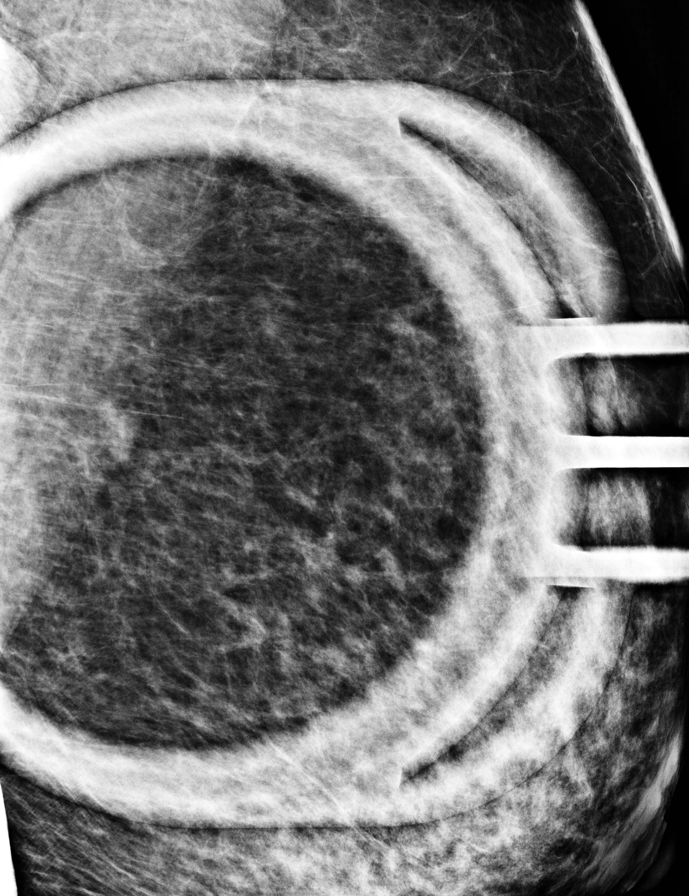

[L MLO synth-2D (1 of 2)]
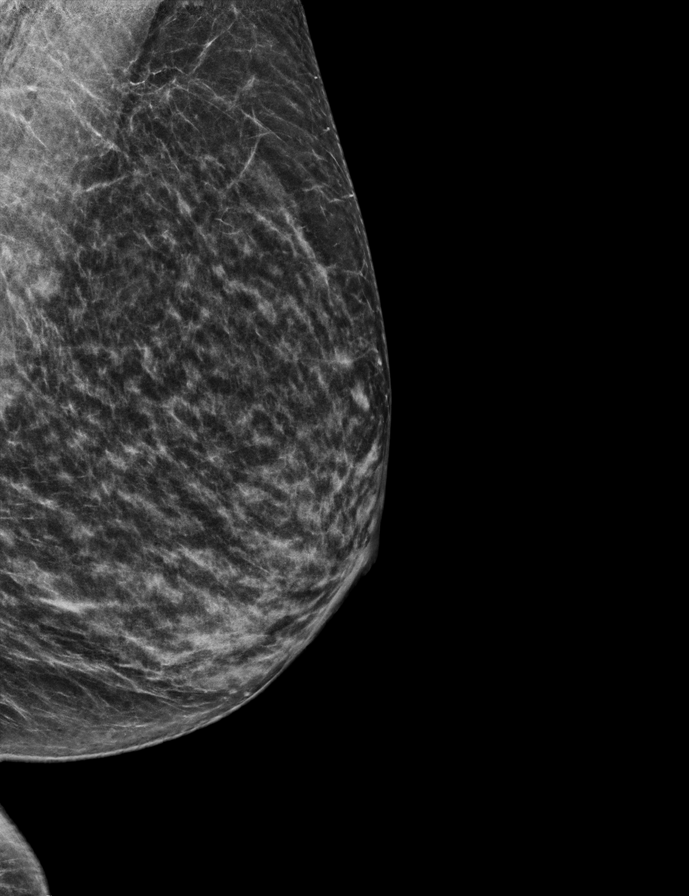

[R CC synth-2D]
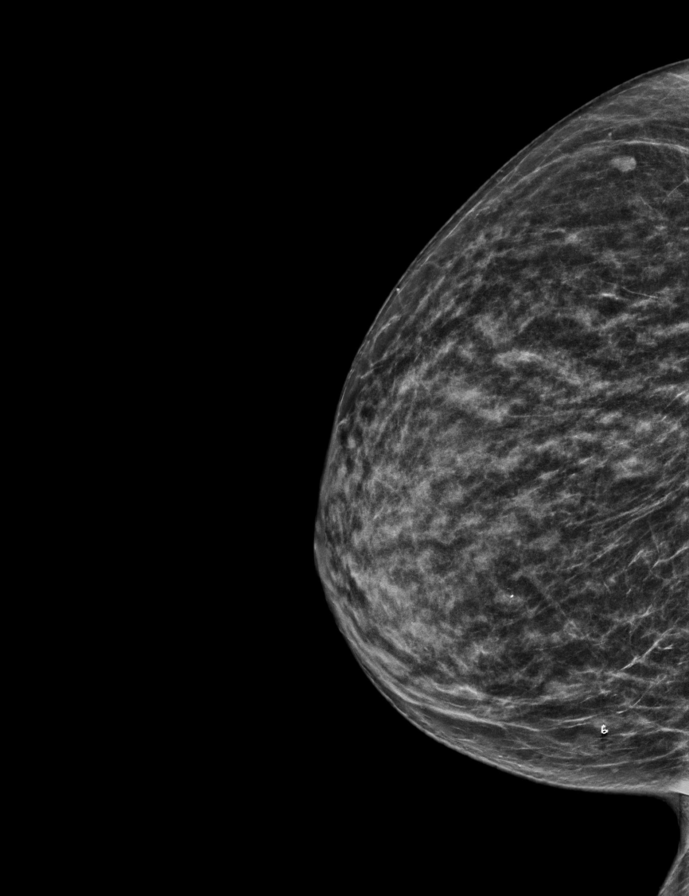

[L MLO synth-2D (2 of 2)]
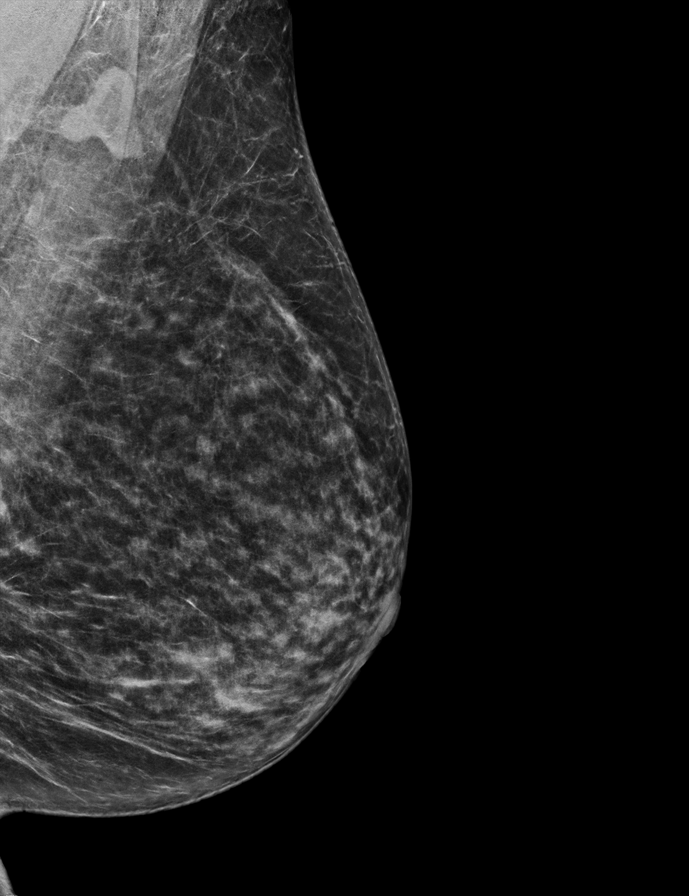

[R MLO synth-2D]
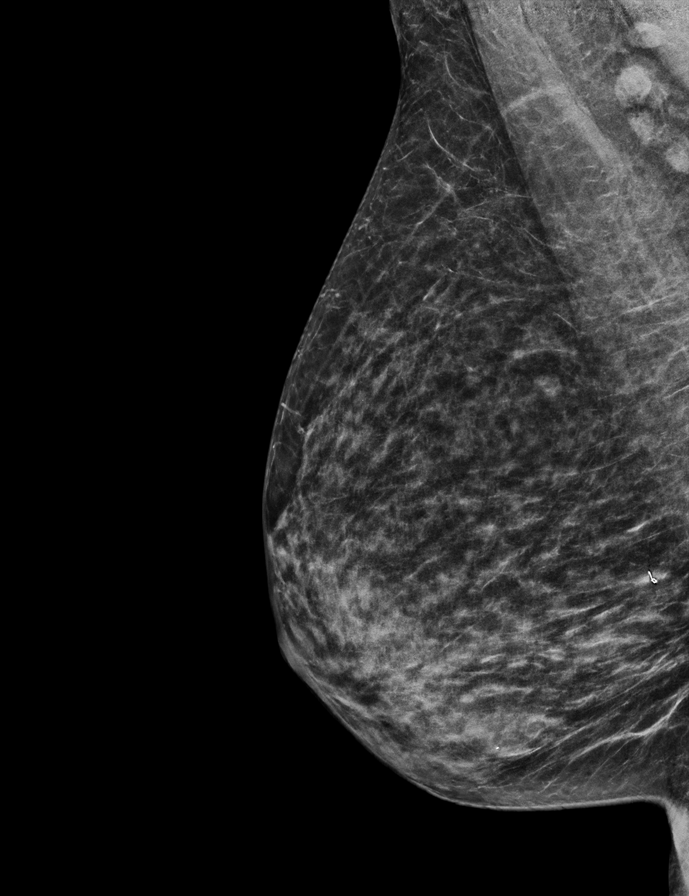

[L CC synth-2D]
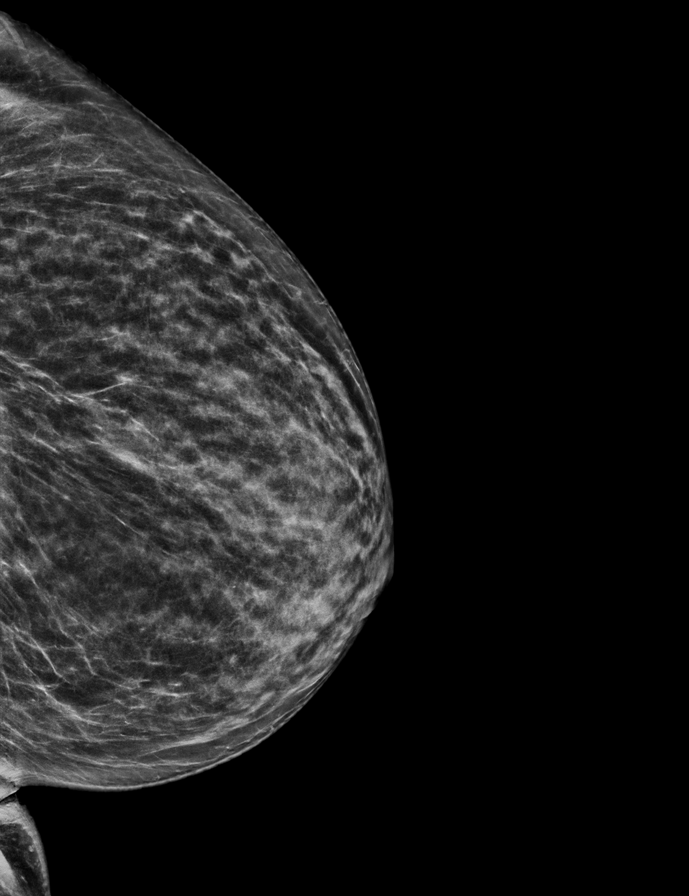

[L MLO tomo · 2 of 52 frames shown]
[frame 17/52]
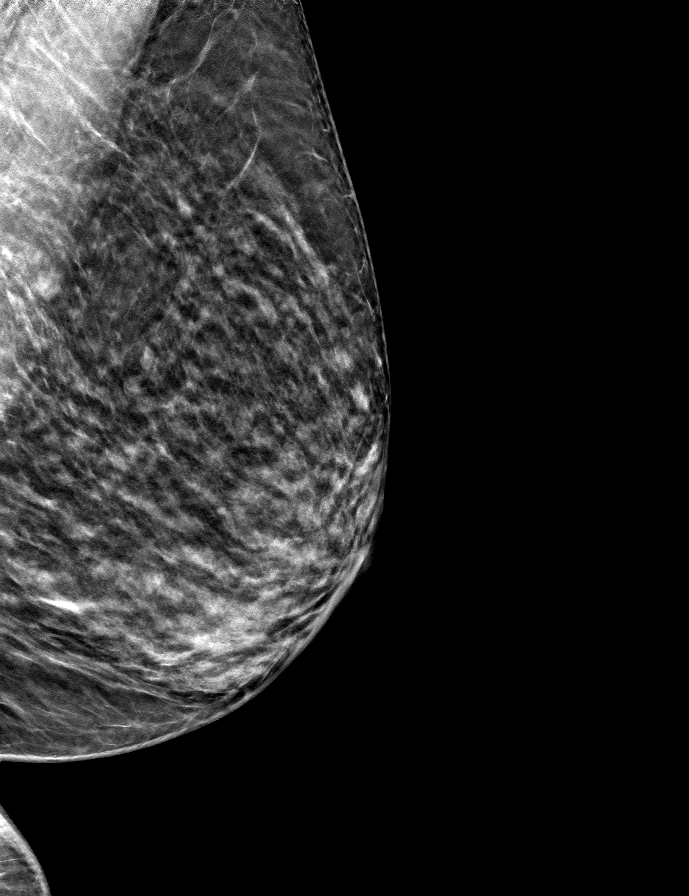
[frame 27/52]
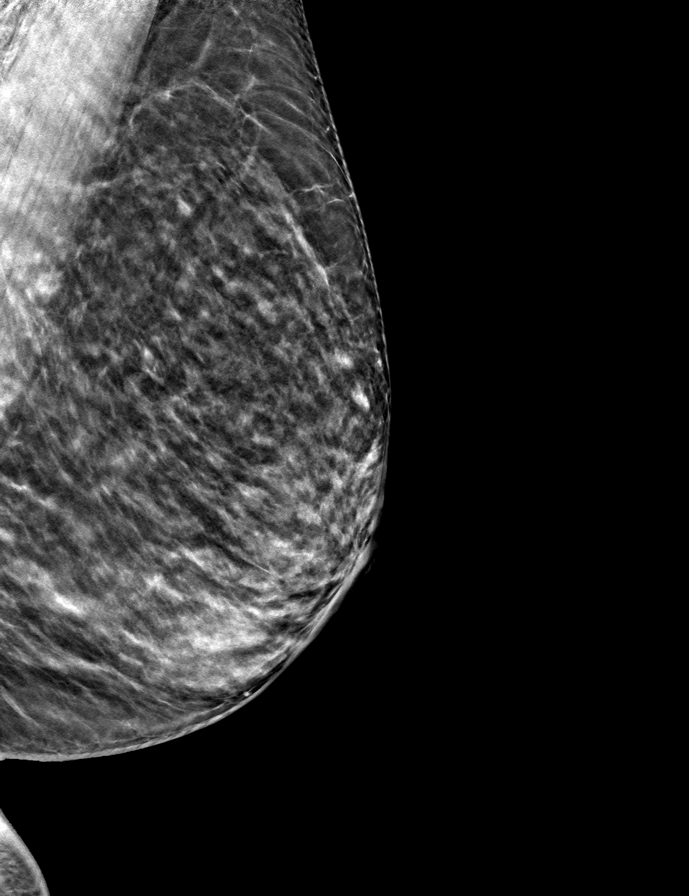

[9 of 32 positions shown; findings below may reference images not displayed]

ACR Breast Density Category c: The breast tissue is heterogeneously
dense, which may obscure small masses.
FINDINGS: Cc and MLO views of bilateral breasts, spot magnification MLO views
of the left breast are submitted. Single coarse benign appearing
calcification is identified in the posterior upper left breast. No
suspicious microcalcifications are noted. No suspicious
abnormalities identified bilaterally.
IMPRESSION: Benign findings.

RECOMMENDATION:
Routine screening mammogram in 1 year.

I have discussed the findings and recommendations with the patient.
If applicable, a reminder letter will be sent to the patient
regarding the next appointment.

BI-RADS CATEGORY  2: Benign.

## 2022-12-15 ENCOUNTER — Other Ambulatory Visit: Payer: Self-pay | Admitting: Internal Medicine

## 2022-12-15 DIAGNOSIS — Z1231 Encounter for screening mammogram for malignant neoplasm of breast: Secondary | ICD-10-CM

## 2022-12-15 DIAGNOSIS — Z780 Asymptomatic menopausal state: Secondary | ICD-10-CM

## 2023-02-07 ENCOUNTER — Ambulatory Visit
Admission: RE | Admit: 2023-02-07 | Discharge: 2023-02-07 | Disposition: A | Discharge status: Home / Self Care | Payer: 59 | Source: Ambulatory Visit | Attending: Internal Medicine | Admitting: Internal Medicine

## 2023-02-07 DIAGNOSIS — Z1231 Encounter for screening mammogram for malignant neoplasm of breast: Secondary | ICD-10-CM | POA: Insufficient documentation

## 2023-02-10 ENCOUNTER — Other Ambulatory Visit: Payer: Self-pay | Admitting: Internal Medicine

## 2023-02-10 DIAGNOSIS — R928 Other abnormal and inconclusive findings on diagnostic imaging of breast: Secondary | ICD-10-CM

## 2023-02-10 DIAGNOSIS — R921 Mammographic calcification found on diagnostic imaging of breast: Secondary | ICD-10-CM

## 2023-03-21 ENCOUNTER — Ambulatory Visit
Admission: RE | Admit: 2023-03-21 | Discharge: 2023-03-21 | Disposition: A | Discharge status: Home / Self Care | Payer: 59 | Source: Ambulatory Visit | Attending: Internal Medicine | Admitting: Internal Medicine

## 2023-03-21 DIAGNOSIS — R921 Mammographic calcification found on diagnostic imaging of breast: Secondary | ICD-10-CM | POA: Diagnosis present

## 2023-03-21 DIAGNOSIS — R928 Other abnormal and inconclusive findings on diagnostic imaging of breast: Secondary | ICD-10-CM | POA: Insufficient documentation

## 2023-03-29 ENCOUNTER — Encounter: Payer: Self-pay | Admitting: Infectious Diseases

## 2023-03-31 ENCOUNTER — Other Ambulatory Visit: Payer: Self-pay | Admitting: Infectious Diseases

## 2023-03-31 ENCOUNTER — Other Ambulatory Visit: Payer: Self-pay | Admitting: Internal Medicine

## 2023-03-31 DIAGNOSIS — R928 Other abnormal and inconclusive findings on diagnostic imaging of breast: Secondary | ICD-10-CM

## 2023-03-31 DIAGNOSIS — R921 Mammographic calcification found on diagnostic imaging of breast: Secondary | ICD-10-CM

## 2023-04-25 ENCOUNTER — Ambulatory Visit
Admission: RE | Admit: 2023-04-25 | Discharge: 2023-04-25 | Disposition: A | Discharge status: Home / Self Care | Payer: 59 | Source: Ambulatory Visit | Attending: Internal Medicine | Admitting: Internal Medicine

## 2023-04-25 DIAGNOSIS — R928 Other abnormal and inconclusive findings on diagnostic imaging of breast: Secondary | ICD-10-CM | POA: Insufficient documentation

## 2023-04-25 DIAGNOSIS — R921 Mammographic calcification found on diagnostic imaging of breast: Secondary | ICD-10-CM

## 2023-04-25 HISTORY — PX: BREAST BIOPSY: SHX20

## 2023-04-25 MED ORDER — LIDOCAINE-EPINEPHRINE 1 %-1:100000 IJ SOLN
20.0000 mL | Freq: Once | INTRAMUSCULAR | Status: AC
  Administered 2023-04-25: 20 mL
  Filled 2023-04-25: qty 20

## 2023-04-25 MED ORDER — LIDOCAINE 1 % OPTIME INJ - NO CHARGE
5.0000 mL | Freq: Once | INTRAMUSCULAR | Status: AC
  Administered 2023-04-25: 5 mL
  Filled 2023-04-25: qty 6

## 2023-04-26 ENCOUNTER — Encounter: Payer: Self-pay | Admitting: *Deleted

## 2023-04-26 NOTE — Progress Notes (Unsigned)
Received referral for newly diagnosed breast cancer from [facility].  Navigation initiated.  Spoke with daughter Maureen to set up appointments.   She will see both med onc and surgeon tomorrow, Dr. Agrawal at 2:00 and Dr. Sakai at 10:15. 

## 2023-04-27 ENCOUNTER — Encounter: Payer: Self-pay | Admitting: *Deleted

## 2023-04-27 NOTE — Progress Notes (Signed)
Patient has spoke to Dr. Kerby Nora and she is declining additional biopsy at this time.   I called patient to discuss getting her set up with medical oncology and surgeon.   She does not want to set that up at this time.  She said she is working on getting her slides and medical records and then will call me to discuss the consultations.  I provided my contact information and also let her know she could go through her PCP Dr. Nemiah Commander for the referrals as well.

## 2023-05-01 ENCOUNTER — Other Ambulatory Visit: Payer: Self-pay | Admitting: Internal Medicine

## 2023-05-01 DIAGNOSIS — R921 Mammographic calcification found on diagnostic imaging of breast: Secondary | ICD-10-CM

## 2023-05-01 DIAGNOSIS — R928 Other abnormal and inconclusive findings on diagnostic imaging of breast: Secondary | ICD-10-CM

## 2023-05-09 ENCOUNTER — Encounter: Payer: Self-pay | Admitting: *Deleted

## 2023-05-09 DIAGNOSIS — D0512 Intraductal carcinoma in situ of left breast: Secondary | ICD-10-CM

## 2023-05-09 NOTE — Progress Notes (Signed)
Erica Perkins would like to receive care at [facility] long, referral placed for medical oncology.

## 2023-05-10 ENCOUNTER — Telehealth: Payer: Self-pay | Admitting: Hematology and Oncology

## 2023-05-10 NOTE — Telephone Encounter (Signed)
Spoke to patient to confirm upcoming morning [facility] clinic appointment on 8/14, paperwork will be sent via mail and e-mail.   Gave location and time, also informed patient that the surgeon's office would be calling as well to get information from them similar to the packet that they will be receiving so make sure to do both.  Reminded patient that all providers will be coming to the clinic to see them HERE and if they had any questions to not hesitate to reach back out to myself or their navigators.

## 2023-05-15 ENCOUNTER — Telehealth: Payer: Self-pay | Admitting: Hematology and Oncology

## 2023-05-15 NOTE — Telephone Encounter (Signed)
Spoke to patient to see if I could clear up any confusion on appointments, she is supposed to go to [place] same day as her clinic appointment, but I told her we could get the [place] appointment rescheduled for her, she said she would need to get her transportation figured out, she had her PC putting in the referrals I told her all the referrals that she had put in could be handled in the [facility], she could come to clinic get all the information and see what the providers had to say and make her decision from there, and to not cancel her plastics appointment, keep that appointment as is. I told er I would get one of the Navigators to give her a call to see if she could explain it a little better on why she needed to see certain providers and go more in depth on what she would encounter in the clinic

## 2023-05-15 NOTE — Telephone Encounter (Signed)
Patient called back to cancel her appointment stating her PCP wanted to see her first before coming to any appointment, I advised it was her PCP who referred her to [place] she said she understands but this is what her PCP requested maybe to help with all of the confusion on all of the appointments, patient does hope to come to the [facility] in the future and asked if I would check in on her from time to time, she thanked me for being consistent and for always answering her phone calls and for calling back if a call was missed

## 2023-05-16 ENCOUNTER — Telehealth: Payer: Self-pay | Admitting: *Deleted

## 2023-05-16 NOTE — Telephone Encounter (Signed)
Cancelled appts for [facility] per patient. She is going to see her PCP before making decisions regarding her care.

## 2023-05-17 ENCOUNTER — Ambulatory Visit: Payer: 59 | Admitting: Radiation Oncology

## 2023-05-17 ENCOUNTER — Other Ambulatory Visit: Payer: 59

## 2023-05-17 ENCOUNTER — Ambulatory Visit: Payer: 59 | Admitting: Physical Therapy

## 2023-05-17 ENCOUNTER — Ambulatory Visit: Payer: 59 | Admitting: Hematology and Oncology

## 2023-05-18 ENCOUNTER — Telehealth: Payer: Self-pay | Admitting: Hematology and Oncology

## 2023-05-18 ENCOUNTER — Telehealth: Payer: Self-pay | Admitting: Plastic Surgery

## 2023-05-18 ENCOUNTER — Institutional Professional Consult (permissible substitution): Payer: 59 | Admitting: Plastic Surgery

## 2023-05-18 NOTE — Telephone Encounter (Signed)
Breast cancer patients never wait. A breast reduction or panniculectomy can be rescheduled but she needs to be seen next week. Thanks.

## 2023-05-18 NOTE — Telephone Encounter (Signed)
Patient scheduled for next week.

## 2023-05-18 NOTE — Telephone Encounter (Signed)
Patient left me a message requesting to get back in for next weeks [facility] on 8/21, I had to leave her a message that, that particular clinic is already full and that 8/28 is open and to call me back if she would like to attend that clinic

## 2023-05-18 NOTE — Telephone Encounter (Signed)
Pt called requesting reschedule for today's appointment (8/15) I gave her the nexr available which is September 11 at 11am. Pt is a breast cancer pt and wants to know if she can be squeezed into the schedule before 9/11. If so,please give her a call.

## 2023-05-18 NOTE — Telephone Encounter (Signed)
Patient called back and accepted the 1215 8/28 clinic appointment, patient already aware of appointment information and has packet from previous appointment and will bring to this appointment, will send a new letter with new appointment date and time

## 2023-05-22 NOTE — Telephone Encounter (Signed)
Patient had appt for 05/24/23. Paient left vmail requesting to cancel appt and reschedule.  I returned her call and patient stated that she just wants to cancel the appt and she will call later to reschedule.

## 2023-05-24 ENCOUNTER — Institutional Professional Consult (permissible substitution): Payer: 59 | Admitting: Plastic Surgery

## 2023-05-29 ENCOUNTER — Encounter: Payer: Self-pay | Admitting: *Deleted

## 2023-05-29 DIAGNOSIS — D0512 Intraductal carcinoma in situ of left breast: Secondary | ICD-10-CM

## 2023-05-31 ENCOUNTER — Inpatient Hospital Stay: Payer: 59 | Attending: Hematology and Oncology

## 2023-05-31 ENCOUNTER — Encounter: Payer: Self-pay | Admitting: Genetic Counselor

## 2023-05-31 ENCOUNTER — Inpatient Hospital Stay (HOSPITAL_BASED_OUTPATIENT_CLINIC_OR_DEPARTMENT_OTHER): Payer: 59 | Admitting: Hematology and Oncology

## 2023-05-31 ENCOUNTER — Other Ambulatory Visit: Payer: Self-pay | Admitting: General Surgery

## 2023-05-31 ENCOUNTER — Ambulatory Visit: Payer: 59 | Admitting: Physical Therapy

## 2023-05-31 ENCOUNTER — Ambulatory Visit
Admission: RE | Admit: 2023-05-31 | Discharge: 2023-05-31 | Disposition: A | Discharge status: Home / Self Care | Payer: 59 | Source: Ambulatory Visit | Attending: Radiation Oncology | Admitting: Radiation Oncology

## 2023-05-31 ENCOUNTER — Inpatient Hospital Stay (HOSPITAL_BASED_OUTPATIENT_CLINIC_OR_DEPARTMENT_OTHER): Payer: 59 | Admitting: Genetic Counselor

## 2023-05-31 ENCOUNTER — Encounter: Payer: Self-pay | Admitting: Hematology and Oncology

## 2023-05-31 ENCOUNTER — Encounter: Payer: Self-pay | Admitting: General Practice

## 2023-05-31 VITALS — BP 167/75 | HR 71 | Temp 97.2°F | Resp 18 | Ht 68.0 in | Wt 140.3 lb

## 2023-05-31 DIAGNOSIS — D0512 Intraductal carcinoma in situ of left breast: Secondary | ICD-10-CM | POA: Diagnosis not present

## 2023-05-31 DIAGNOSIS — Z171 Estrogen receptor negative status [ER-]: Secondary | ICD-10-CM | POA: Insufficient documentation

## 2023-05-31 LAB — CMP (CANCER CENTER ONLY)
ALT: 14 U/L (ref 0–44)
AST: 14 U/L — ABNORMAL LOW (ref 15–41)
Albumin: 4.4 g/dL (ref 3.5–5.0)
Alkaline Phosphatase: 49 U/L (ref 38–126)
Anion gap: 3 — ABNORMAL LOW (ref 5–15)
BUN: 14 mg/dL (ref 8–23)
CO2: 33 mmol/L — ABNORMAL HIGH (ref 22–32)
Calcium: 9.6 mg/dL (ref 8.9–10.3)
Chloride: 105 mmol/L (ref 98–111)
Creatinine: 0.58 mg/dL (ref 0.44–1.00)
GFR, Estimated: >60 mL/min (ref >60)
Glucose, Bld: 88 mg/dL (ref 70–99)
Potassium: 4.2 mmol/L (ref 3.5–5.1)
Sodium: 141 mmol/L (ref 135–145)
Total Bilirubin: 0.4 mg/dL (ref 0.3–1.2)
Total Protein: 7.8 g/dL (ref 6.5–8.1)

## 2023-05-31 LAB — CBC WITH DIFFERENTIAL (CANCER CENTER ONLY)
Abs Immature Granulocytes: 0.03 10*3/uL (ref 0.00–0.07)
Basophils Absolute: 0 10*3/uL (ref 0.0–0.1)
Basophils Relative: 0 %
Eosinophils Absolute: 0.1 10*3/uL (ref 0.0–0.5)
Eosinophils Relative: 1 %
HCT: 38.2 % (ref 36.0–46.0)
Hemoglobin: 11.9 g/dL — ABNORMAL LOW (ref 12.0–15.0)
Immature Granulocytes: 1 %
Lymphocytes Relative: 38 %
Lymphs Abs: 1.9 10*3/uL (ref 0.7–4.0)
MCH: 27.2 pg (ref 26.0–34.0)
MCHC: 31.2 g/dL (ref 30.0–36.0)
MCV: 87.2 fL (ref 80.0–100.0)
Monocytes Absolute: 0.5 10*3/uL (ref 0.1–1.0)
Monocytes Relative: 10 %
Neutro Abs: 2.5 10*3/uL (ref 1.7–7.7)
Neutrophils Relative %: 50 %
Platelet Count: 223 10*3/uL (ref 150–400)
RBC: 4.38 MIL/uL (ref 3.87–5.11)
RDW: 15.3 % (ref 11.5–15.5)
WBC Count: 4.9 10*3/uL (ref 4.0–10.5)
nRBC: 0 % (ref 0.0–0.2)

## 2023-05-31 LAB — GENETIC SCREENING ORDER

## 2023-05-31 NOTE — Progress Notes (Signed)
[facility] Multidisciplinary Clinic Spiritual Care Note  Met with Erica Perkins" in Breast Multidisciplinary Clinic to introduce Support Center team/resources.  She completed SDOH screening; results follow below.    SDOH Screenings   Food Insecurity: No Food Insecurity (05/31/2023)  Housing: Low Risk  (05/31/2023)  Transportation Needs: No Transportation Needs (05/31/2023)  Utilities: Not At Risk (05/31/2023)  Depression (PHQ2-9): Low Risk  (05/31/2023)  Financial Resource Strain: Low Risk  (05/18/2023)   Received from [facility]  Tobacco Use: Low Risk  (05/31/2023)    Chaplain and patient discussed common feelings and emotions when being diagnosed with cancer, and the importance of support during treatment.  Chaplain informed patient of the support team and support services at [facility].  Chaplain provided contact information and encouraged patient to call with any questions or concerns.  [name] was upbeat and reports regular exercise and good health; this helps her feel in good stead to manage next steps.  Follow up needed: Yes.  We plan to follow up by phone in ca two weeks for check-in.   [address] [place], [place], [facility] Pager [phone] Voicemail [phone]

## 2023-05-31 NOTE — Progress Notes (Signed)
Radiation Oncology         [phone] ________________________________  Multidisciplinary Breast Oncology Clinic [facility]) Initial Outpatient Consultation  Name: Erica Perkins MRN: [id number]  Date: 05/31/2023  DOB: [birth year]  IR:JJOACZYSA, [place]nita [place], MD  Emelia Loron, MD   REFERRING PHYSICIAN: Emelia Loron, MD  DIAGNOSIS: The encounter diagnosis was Ductal carcinoma in situ (DCIS) of left breast.  Stage 0 (cTis (DCIS), cN0, cM0) Left Breast, High-grade DCIS, ER- / PR- / Her2 not assessed    ICD-10-CM   1. Ductal carcinoma in situ (DCIS) of left breast  D05.12       HISTORY OF PRESENT ILLNESS::Erica Perkins is a 67 y.o. female who is presenting to the office today for evaluation of her newly diagnosed breast cancer. She is doing well overall.   She had routine screening mammography on 02/07/23 showing a possible calcifications in the left breast. She underwent a left breast diagnostic mammography with tomography at [facility] on 03/21/23 showing an indeterminate 14 mm group of calcifications in the upper outer left breast, and a loose group of 3 adjacent punctate calcifications in the upper outer left breast.   Biopsy of the upper outer left breast on 04/25/23 showed: high grade DCIS with associated calcifications. Prognostic indicators significant for: estrogen receptor, 0% negative and progesterone receptor, 0% negative. Her2 not assessed.   Menarche: 67 years old GP: 0 LMP: in 2006 Contraceptive: yes, reports using BC for one month in 1976 HRT: never used    The patient was referred today for presentation in the multidisciplinary conference.  Radiology studies and pathology slides were presented there for review and discussion of treatment options.  A consensus was discussed regarding potential next steps.  PREVIOUS RADIATION THERAPY: No  PAST MEDICAL HISTORY: No past medical history on file.  PAST SURGICAL HISTORY: Past Surgical History:   Procedure Laterality Date   BREAST BIOPSY Right 01/26/2016   u/s core/ neg FIBROADENOMA   BREAST BIOPSY Left 04/25/2023   Affirm bx, ribbon marker, HIGH-GRADE COMEDO-TYPE DUCTAL CARCINOMA IN SITU (DCIS) WITH ASSOCIATED CALCIFICATIONS   BREAST BIOPSY Left 04/25/2023   MM LT BREAST BX W LOC DEV 1ST LESION IMAGE BX SPEC STEREO GUIDE 04/25/2023 [facility]    FAMILY HISTORY:  Family History  Problem Relation Age of Onset   Breast cancer Sister 11 - 53   Breast cancer Maternal Aunt        dx. unknown age   Colon cancer Neg Hx     SOCIAL HISTORY:  Social History   Socioeconomic History   Marital status: Divorced    Spouse name: Not on file   Number of children: Not on file   Years of education: Not on file   Highest education level: Not on file  Occupational History   Not on file  Tobacco Use   Smoking status: Never   Smokeless tobacco: Never  Vaping Use   Vaping status: Never Used  Substance and Sexual Activity   Alcohol use: Not Currently    Comment: rarely   Drug use: No   Sexual activity: Not on file  Other Topics Concern   Not on file  Social History Narrative   Not on file   Social Determinants of Health   Financial Resource Strain: Low Risk  (05/18/2023)   Received from [facility]   Overall Financial Resource Strain (CARDIA)    Difficulty of Paying Living Expenses: Not hard at all  Food Insecurity: No Food Insecurity (05/31/2023)   Hunger Vital  Sign    Worried About Programme researcher, broadcasting/film/video in the Last Year: Never true    Ran Out of Food in the Last Year: Never true  Transportation Needs: No Transportation Needs (05/31/2023)   PRAPARE - Administrator, Civil Service (Medical): No    Lack of Transportation (Non-Medical): No  Physical Activity: Not on file  Stress: Not on file  Social Connections: Not on file    ALLERGIES: No Known Allergies  MEDICATIONS:  No current outpatient medications on file.   Current  Facility-Administered Medications  Medication Dose Route Frequency Provider Last Rate Last Admin   0.9 %  sodium chloride infusion  500 mL Intravenous Continuous Nandigam, Eleonore Chiquito, MD        REVIEW OF SYSTEMS: A 10+ POINT REVIEW OF SYSTEMS WAS OBTAINED including neurology, dermatology, psychiatry, cardiac, respiratory, lymph, extremities, GI, GU, musculoskeletal, constitutional, reproductive, HEENT. On the provided form, she reports wearing glasses and wearing contacts. She denies any other symptoms.    PHYSICAL EXAM:     05/31/2023  Vitals with BMI   Height 5\' 8"    Weight 140 lbs 5 oz   BMI 21.34   Systolic 167 !   Diastolic 75 !   Pulse 71     Legend: ! Abnormal  Lungs are clear to auscultation bilaterally. Heart has regular rate and rhythm. No palpable cervical, supraclavicular, or axillary adenopathy. Abdomen soft, non-tender, normal bowel sounds. Breast: Right breast with no palpable mass, nipple discharge, or bleeding. Left breast with biopsy site in the lateral aspect of the breast with mild bruising noted. No dominant mass nipple discharge or bleeding appreciated.    KPS = 100  100 - Normal; no complaints; no evidence of disease. 90   - Able to carry on normal activity; minor signs or symptoms of disease. 80   - Normal activity with effort; some signs or symptoms of disease. 2   - Cares for self; unable to carry on normal activity or to do active work. 60   - Requires occasional assistance, but is able to care for most of his personal needs. 50   - Requires considerable assistance and frequent medical care. 40   - Disabled; requires special care and assistance. 30   - Severely disabled; hospital admission is indicated although death not imminent. 20   - Very sick; hospital admission necessary; active supportive treatment necessary. 10   - Moribund; fatal processes progressing rapidly. 0     - Dead  Karnofsky DA, Abelmann WH, Craver LS and Burchenal [facility] [phone]) The use of  the nitrogen mustards in the palliative treatment of carcinoma: with particular reference to bronchogenic carcinoma Cancer 1 [phone]  LABORATORY DATA:  Lab Results  Component Value Date   WBC 4.9 05/31/2023   HGB 11.9 (L) 05/31/2023   HCT 38.2 05/31/2023   MCV 87.2 05/31/2023   PLT 223 05/31/2023   Lab Results  Component Value Date   NA 141 05/31/2023   K 4.2 05/31/2023   CL 105 05/31/2023   CO2 33 (H) 05/31/2023   Lab Results  Component Value Date   ALT 14 05/31/2023   AST 14 (L) 05/31/2023   ALKPHOS 49 05/31/2023   BILITOT 0.4 05/31/2023    PULMONARY FUNCTION TEST:   Review Flowsheet        No data to display          RADIOGRAPHY: No results found.    IMPRESSION: Stage 0 (cTis (DCIS), cN0, cM0)  Left Breast, High-grade DCIS, ER- / PR- / Her2 not assessed  Patient will be a good candidate for breast conservation with radiotherapy to the left breast. We discussed the general course of radiation, potential side effects, and toxicities with radiation and the patient is interested in this approach.    PLAN:  Genetics  Left lumpectomy  Oncotype on additional calcifications if malignancy detected Adjuvant radiation therapy    ------------------------------------------------  Billie Lade, PhD, MD  This document serves as a record of services personally performed by Antony Blackbird, MD. It was created on his behalf by Neena Rhymes, a trained medical scribe. The creation of this record is based on the scribe's personal observations and the provider's statements to them. This document has been checked and approved by the attending provider.

## 2023-05-31 NOTE — Assessment & Plan Note (Signed)
04/25/2023:Screening mammogram detected left breast calcifications.  2 groups for screw 1.4 cm biopsy high-grade DCIS with calcifications comedonecrosis ER 0%, PR 0%, second group of calcifications 0.8 cm: Not biopsied yet  Pathology and radiology counseling: Discussed with the patient, the details of pathology including the type of breast cancer,the clinical staging, the significance of ER, PR and HER-2/neu receptors and the implications for treatment. After reviewing the pathology in detail, we proceeded to discuss the different treatment options between surgery, radiation, chemotherapy, antiestrogen therapies.  Treatment plan: Breast conserving surgery Adjuvant radiation therapy Genetics consultation  Return to clinic after surgery is complete

## 2023-05-31 NOTE — Progress Notes (Signed)
[facility] CONSULT NOTE  Patient Care Team: Enid Baas, MD as PCP - General (Internal Medicine) Jim Like, RN as Registered Nurse Scarlett Presto, RN (Inactive) as Registered Nurse Serena Croissant, MD as Consulting Physician (Hematology and Oncology) Emelia Loron, MD as Consulting Physician (General Surgery) Antony Blackbird, MD as Consulting Physician (Radiation Oncology)  CHIEF COMPLAINTS/PURPOSE OF CONSULTATION:  Newly diagnosed breast cancer  HISTORY OF PRESENTING ILLNESS:  Erica Perkins 67 y.o. female is here because of recent diagnosis of left breast cancer.  Patient had a routine mammogram that detected left breast calcifications.  2 groups were identified.  1 was 1.4 cm which on biopsy came back as high-grade DCIS.  Second group of calcification has not been biopsied but it is adjacent to the primary group and would likely be to be removed during surgery.  She was presented this morning at the multidiscipline tumor board and she is here today with [facility] clinic to discuss her treatment plan.  I reviewed her records extensively and collaborated the history with the patient.  SUMMARY OF ONCOLOGIC HISTORY: Oncology History  Ductal carcinoma in situ (DCIS) of left breast  04/25/2023 Initial Diagnosis   Screening mammogram detected left breast calcifications.  2 groups for screw 1.4 cm biopsy high-grade DCIS with calcifications comedonecrosis ER 0%, PR 0%, second group of calcifications 0.8 cm: Not biopsied yet   05/31/2023 Cancer Staging   Staging form: Breast, AJCC 8th Edition - Clinical: Stage 0 (cTis (DCIS), cN0, cM0, G3, ER-, PR-, HER2: Equivocal) - Signed by Serena Croissant, MD on 05/31/2023 Stage prefix: Initial diagnosis Histologic grading system: 3 grade system      MEDICAL HISTORY:  History reviewed. No pertinent past medical history.  SURGICAL HISTORY: Past Surgical History:  Procedure Laterality Date   BREAST BIOPSY Right 01/26/2016    u/s core/ neg FIBROADENOMA   BREAST BIOPSY Left 04/25/2023   Affirm bx, ribbon marker, HIGH-GRADE COMEDO-TYPE DUCTAL CARCINOMA IN SITU (DCIS) WITH ASSOCIATED CALCIFICATIONS   BREAST BIOPSY Left 04/25/2023   MM LT BREAST BX W LOC DEV 1ST LESION IMAGE BX SPEC STEREO GUIDE 04/25/2023 [facility]    SOCIAL HISTORY: Social History   Socioeconomic History   Marital status: Divorced    Spouse name: Not on file   Number of children: Not on file   Years of education: Not on file   Highest education level: Not on file  Occupational History   Not on file  Tobacco Use   Smoking status: Never   Smokeless tobacco: Never  Vaping Use   Vaping status: Never Used  Substance and Sexual Activity   Alcohol use: Not Currently    Comment: rarely   Drug use: No   Sexual activity: Not on file  Other Topics Concern   Not on file  Social History Narrative   Not on file   Social Determinants of Health   Financial Resource Strain: Low Risk  (05/18/2023)   Received from [facility]   Overall Financial Resource Strain (CARDIA)    Difficulty of Paying Living Expenses: Not hard at all  Food Insecurity: No Food Insecurity (05/18/2023)   Received from [facility]   Hunger Vital Sign    Worried About Running Out of Food in the Last Year: Never true    Ran Out of Food in the Last Year: Never true  Transportation Needs: No Transportation Needs (05/18/2023)   Received from [facility]   [facility] - Transportation  In the past 12 months, has lack of transportation kept you from medical appointments or from getting medications?: No    Lack of Transportation (Non-Medical): No  Physical Activity: Not on file  Stress: Not on file  Social Connections: Not on file  Intimate Partner Violence: Not on file    FAMILY HISTORY: Family History  Problem Relation Age of Onset   Breast cancer Sister 51 - 34   Breast cancer Maternal Aunt        dx. unknown  age   Colon cancer Neg Hx     ALLERGIES:  has No Known Allergies.  MEDICATIONS:  No current outpatient medications on file.   Current Facility-Administered Medications  Medication Dose Route Frequency Provider Last Rate Last Admin   0.9 %  sodium chloride infusion  500 mL Intravenous Continuous Nandigam, Kavitha V, MD        REVIEW OF SYSTEMS:   Constitutional: Denies fevers, chills or abnormal night sweats Breast:  Denies any palpable lumps or discharge All other systems were reviewed with the patient and are negative.  PHYSICAL EXAMINATION: ECOG PERFORMANCE STATUS: 0 - Asymptomatic  Vitals:   05/31/23 1244  BP: (!) 167/75  Pulse: 71  Resp: 18  Temp: (!) 97.2 F (36.2 C)  SpO2: 98%   Filed Weights   05/31/23 1244  Weight: 140 lb 4.8 oz (63.6 kg)    GENERAL:alert, no distress and comfortable    LABORATORY DATA:  I have reviewed the data as listed Lab Results  Component Value Date   WBC 4.9 05/31/2023   HGB 11.9 (L) 05/31/2023   HCT 38.2 05/31/2023   MCV 87.2 05/31/2023   PLT 223 05/31/2023   Lab Results  Component Value Date   NA 141 05/31/2023   K 4.2 05/31/2023   CL 105 05/31/2023   CO2 33 (H) 05/31/2023    RADIOGRAPHIC STUDIES: I have personally reviewed the radiological reports and agreed with the findings in the report.  ASSESSMENT AND PLAN:  Ductal carcinoma in situ (DCIS) of left breast 04/25/2023:Screening mammogram detected left breast calcifications.  2 groups for screw 1.4 cm biopsy high-grade DCIS with calcifications comedonecrosis ER 0%, PR 0%, second group of calcifications 0.8 cm: Not biopsied yet  Pathology and radiology counseling: Discussed with the patient, the details of pathology including the type of breast cancer,the clinical staging, the significance of ER, PR and HER-2/neu receptors and the implications for treatment. After reviewing the pathology in detail, we proceeded to discuss the different treatment options between surgery,  radiation, chemotherapy, antiestrogen therapies.  Treatment plan: Breast conserving surgery Adjuvant radiation therapy Genetics consultation  Return to clinic after surgery is complete  All questions were answered. The patient knows to call the clinic with any problems, questions or concerns.    Tamsen Meek, MD 05/31/23

## 2023-05-31 NOTE — Progress Notes (Signed)
REFERRING PROVIDER: Serena Croissant, MD  PRIMARY PROVIDER:  Enid Baas, MD  PRIMARY REASON FOR VISIT:  1. Ductal carcinoma in situ (DCIS) of left breast    HISTORY OF PRESENT ILLNESS:   [name], a 67 y.o. female, was seen for a [place] cancer genetics consultation at the request of Dr. Pamelia Hoit due to a personal and family history of cancer.  Erica Perkins presents to clinic today to discuss the possibility of a hereditary predisposition to cancer, to discuss genetic testing, and to further clarify her future cancer risks, as well as potential cancer risks for family members.   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  CANCER HISTORY:  Oncology History  Ductal carcinoma in situ (DCIS) of left breast  04/25/2023 Initial Diagnosis   Screening mammogram detected left breast calcifications.  2 groups for screw 1.4 cm biopsy high-grade DCIS with calcifications comedonecrosis ER 0%, PR 0%, second group of calcifications 0.8 cm: Not biopsied yet   05/31/2023 Cancer Staging   Staging form: Breast, AJCC 8th Edition - Clinical: Stage 0 (cTis (DCIS), cN0, cM0, G3, ER-, PR-, HER2: Equivocal) - Signed by Serena Croissant, MD on 05/31/2023 Stage prefix: Initial diagnosis Histologic grading system: 3 grade system    Past Surgical History:  Procedure Laterality Date   BREAST BIOPSY Right 01/26/2016   u/s core/ neg FIBROADENOMA   BREAST BIOPSY Left 04/25/2023   Affirm bx, ribbon marker, HIGH-GRADE COMEDO-TYPE DUCTAL CARCINOMA IN SITU (DCIS) WITH ASSOCIATED CALCIFICATIONS   BREAST BIOPSY Left 04/25/2023   MM LT BREAST BX W LOC DEV 1ST LESION IMAGE BX SPEC STEREO GUIDE 04/25/2023 [facility]    Social History   Socioeconomic History   Marital status: Divorced    Spouse name: Not on file   Number of children: Not on file   Years of education: Not on file   Highest education level: Not on file   Occupational History   Not on file  Tobacco Use   Smoking status: Never   Smokeless tobacco: Never  Vaping Use   Vaping status: Never Used  Substance and Sexual Activity   Alcohol use: Not Currently    Comment: rarely   Drug use: No   Sexual activity: Not on file  Other Topics Concern   Not on file  Social History Narrative   Not on file   Social Determinants of Health   Financial Resource Strain: Low Risk  (05/18/2023)   Received from [facility]   Overall Financial Resource Strain (CARDIA)    Difficulty of Paying Living Expenses: Not hard at all  Food Insecurity: No Food Insecurity (05/18/2023)   Received from [facility]   Hunger Vital Sign    Worried About Running Out of Food in the Last Year: Never true    Ran Out of Food in the Last Year: Never true  Transportation Needs: No Transportation Needs (05/18/2023)   Received from [facility] - Transportation    In the past 12 months, has lack of transportation kept you from medical appointments or from getting medications?: No    Lack of Transportation (Non-Medical): No  Physical Activity: Not on file  Stress: Not on file  Social Connections: Not on file     FAMILY HISTORY:  We obtained a detailed, 4-generation family history.  Significant diagnoses are listed below: Family History  Problem Relation Age  of Onset   Breast cancer Sister 26 - 87   Breast cancer Maternal Aunt        dx. unknown age   Colon cancer Neg Hx    Ms. Califano is unaware of previous family history of genetic testing for hereditary cancer risks. There is no reported Ashkenazi Jewish ancestry.     GENETIC COUNSELING ASSESSMENT: Erica Perkins is a 67 y.o. female with a personal and family history of cancer which is somewhat suggestive of a hereditary predisposition to cancer. We, therefore, discussed and recommended the following at today's visit.   DISCUSSION: We discussed that 5  - 10% of cancer is hereditary, with most cases of breast cancer associated with BRCA1/2  There are other genes that can be associated with hereditary breast cancer syndromes.  We discussed that testing is beneficial for several reasons including knowing how to follow individuals after completing their treatment, identifying whether potential treatment options would be beneficial, and understanding if other family members could be at risk for cancer and allowing them to undergo genetic testing.   We reviewed the characteristics, features and inheritance patterns of hereditary cancer syndromes. We also discussed genetic testing, including the appropriate family members to test, the process of testing, insurance coverage and turn-around-time for results. We discussed the implications of a negative, positive, carrier and/or variant of uncertain significant result.   Based on [name]'s personal and family history of cancer, she meets medical criteria for genetic testing. We recommended Erica Perkins pursue genetic testing for a panel that includes genes associated with breast cancer.   PLAN: Despite our recommendation, Erica Perkins did not wish to pursue genetic testing at today's visit. We understand this decision and remain available to coordinate genetic testing at any time in the future. We, therefore, recommend [name] continue to follow the cancer screening guidelines given by her primary healthcare provider.  Erica Perkins questions were answered to her satisfaction today. Our contact information was provided should additional questions or concerns arise. Thank you for the referral and allowing [place] to share in the care of your patient.   Lalla Brothers, MS, [facility] Genetic Counselor [email or web address] (P) [phone]  The patient was seen for a total of <15 minutes in face-to-face genetic counseling. The patient was seen alone. Drs. Pamelia Hoit and/or Mosetta Putt were available to  discuss this case as needed.   _______________________________________________________________________ For Office Staff:  Number of people involved in session: 1 Was an Intern/ student involved with case: no

## 2023-06-01 ENCOUNTER — Encounter: Payer: Self-pay | Admitting: *Deleted

## 2023-06-01 DIAGNOSIS — D0512 Intraductal carcinoma in situ of left breast: Secondary | ICD-10-CM

## 2023-06-02 ENCOUNTER — Other Ambulatory Visit: Payer: Self-pay | Admitting: General Surgery

## 2023-06-02 ENCOUNTER — Telehealth: Payer: Self-pay | Admitting: Radiation Oncology

## 2023-06-02 DIAGNOSIS — D0512 Intraductal carcinoma in situ of left breast: Secondary | ICD-10-CM

## 2023-06-02 NOTE — Telephone Encounter (Signed)
8/30 @ 9:19 am Left voicemail for patient to call our office to be schedule for follow up consult.

## 2023-06-06 ENCOUNTER — Telehealth: Payer: Self-pay | Admitting: Radiation Oncology

## 2023-06-06 NOTE — Telephone Encounter (Signed)
Pt LVM to double check upcoming appts. Called pt back to verify RadOnc appts.

## 2023-06-07 ENCOUNTER — Telehealth: Payer: Self-pay | Admitting: Hematology and Oncology

## 2023-06-07 NOTE — Telephone Encounter (Signed)
Scheduled appointment per scheduling message. Patient is aware of the made appointment. 

## 2023-06-13 ENCOUNTER — Encounter (HOSPITAL_BASED_OUTPATIENT_CLINIC_OR_DEPARTMENT_OTHER): Payer: Self-pay | Admitting: General Surgery

## 2023-06-13 ENCOUNTER — Encounter: Payer: Self-pay | Admitting: *Deleted

## 2023-06-13 ENCOUNTER — Other Ambulatory Visit: Payer: Self-pay

## 2023-06-14 ENCOUNTER — Institutional Professional Consult (permissible substitution): Payer: 59 | Admitting: Plastic Surgery

## 2023-06-15 ENCOUNTER — Encounter: Payer: Self-pay | Admitting: General Practice

## 2023-06-15 NOTE — Progress Notes (Signed)
[facility] Spiritual Care Note  Followed up with Erica Perkins by phone. She was in good spirits, looking forward to completing surgery on 06/20/2023. She welcomes a post-op check-in as well, and is also aware of ongoing chaplain availability in case she would like to reach out herself for further support.   [address] [place], [place], [facility] Pager [phone] Voicemail [phone]

## 2023-06-16 ENCOUNTER — Ambulatory Visit
Admission: RE | Admit: 2023-06-16 | Discharge: 2023-06-16 | Disposition: A | Discharge status: Home / Self Care | Payer: 59 | Source: Ambulatory Visit | Attending: General Surgery | Admitting: General Surgery

## 2023-06-16 DIAGNOSIS — D0512 Intraductal carcinoma in situ of left breast: Secondary | ICD-10-CM

## 2023-06-16 HISTORY — PX: BREAST BIOPSY: SHX20

## 2023-06-20 ENCOUNTER — Ambulatory Visit (HOSPITAL_BASED_OUTPATIENT_CLINIC_OR_DEPARTMENT_OTHER): Payer: 59 | Admitting: Certified Registered Nurse Anesthetist

## 2023-06-20 ENCOUNTER — Ambulatory Visit
Admission: RE | Admit: 2023-06-20 | Discharge: 2023-06-20 | Disposition: A | Discharge status: Home / Self Care | Payer: 59 | Source: Ambulatory Visit | Attending: General Surgery | Admitting: General Surgery

## 2023-06-20 ENCOUNTER — Other Ambulatory Visit: Payer: Self-pay

## 2023-06-20 ENCOUNTER — Ambulatory Visit (HOSPITAL_BASED_OUTPATIENT_CLINIC_OR_DEPARTMENT_OTHER)
Admission: RE | Admit: 2023-06-20 | Discharge: 2023-06-20 | Disposition: A | Discharge status: Home / Self Care | Payer: 59 | Attending: General Surgery | Admitting: General Surgery

## 2023-06-20 ENCOUNTER — Encounter (HOSPITAL_BASED_OUTPATIENT_CLINIC_OR_DEPARTMENT_OTHER): Payer: Self-pay | Admitting: General Surgery

## 2023-06-20 ENCOUNTER — Encounter (HOSPITAL_BASED_OUTPATIENT_CLINIC_OR_DEPARTMENT_OTHER)
Admission: RE | Disposition: A | Discharge status: Home / Self Care | Payer: Self-pay | Source: Home / Self Care | Attending: General Surgery

## 2023-06-20 DIAGNOSIS — Z803 Family history of malignant neoplasm of breast: Secondary | ICD-10-CM | POA: Insufficient documentation

## 2023-06-20 DIAGNOSIS — D0512 Intraductal carcinoma in situ of left breast: Secondary | ICD-10-CM | POA: Insufficient documentation

## 2023-06-20 DIAGNOSIS — D0502 Lobular carcinoma in situ of left breast: Secondary | ICD-10-CM | POA: Diagnosis not present

## 2023-06-20 DIAGNOSIS — Z171 Estrogen receptor negative status [ER-]: Secondary | ICD-10-CM | POA: Diagnosis not present

## 2023-06-20 HISTORY — PX: BREAST LUMPECTOMY WITH RADIOACTIVE SEED LOCALIZATION: SHX6424

## 2023-06-20 HISTORY — DX: Intraductal carcinoma in situ of unspecified breast: D05.10

## 2023-06-20 HISTORY — DX: Pure hypercholesterolemia, unspecified: E78.00

## 2023-06-20 SURGERY — BREAST LUMPECTOMY WITH RADIOACTIVE SEED LOCALIZATION
Anesthesia: General | Site: Breast | Laterality: Left

## 2023-06-20 MED ORDER — ONDANSETRON HCL 4 MG/2ML IJ SOLN
INTRAMUSCULAR | Status: DC | PRN
  Administered 2023-06-20: 4 mg via INTRAVENOUS

## 2023-06-20 MED ORDER — CEFAZOLIN SODIUM-DEXTROSE 2-4 GM/100ML-% IV SOLN
2.0000 g | INTRAVENOUS | Status: AC
  Administered 2023-06-20: 2 g via INTRAVENOUS

## 2023-06-20 MED ORDER — FENTANYL CITRATE (PF) 100 MCG/2ML IJ SOLN
INTRAMUSCULAR | Status: DC | PRN
  Administered 2023-06-20: 50 ug via INTRAVENOUS

## 2023-06-20 MED ORDER — CHLORHEXIDINE GLUCONATE CLOTH 2 % EX PADS: 6.0000 | MEDICATED_PAD | Freq: Once | CUTANEOUS | Status: DC

## 2023-06-20 MED ORDER — LACTATED RINGERS IV SOLN: INTRAVENOUS | Status: DC

## 2023-06-20 MED ORDER — OXYCODONE HCL 5 MG PO TABS: 5.0000 mg | ORAL_TABLET | Freq: Once | ORAL | Status: DC | PRN

## 2023-06-20 MED ORDER — OXYCODONE HCL 5 MG/5ML PO SOLN: 5.0000 mg | Freq: Once | ORAL | Status: DC | PRN

## 2023-06-20 MED ORDER — LIDOCAINE HCL (CARDIAC) PF 100 MG/5ML IV SOSY
PREFILLED_SYRINGE | INTRAVENOUS | Status: DC | PRN
  Administered 2023-06-20: 60 mg via INTRAVENOUS

## 2023-06-20 MED ORDER — ACETAMINOPHEN 500 MG PO TABS
ORAL_TABLET | ORAL | Status: AC
  Filled 2023-06-20: qty 2

## 2023-06-20 MED ORDER — BUPIVACAINE HCL (PF) 0.25 % IJ SOLN
INTRAMUSCULAR | Status: AC
  Filled 2023-06-20: qty 30

## 2023-06-20 MED ORDER — BUPIVACAINE-EPINEPHRINE (PF) 0.25% -1:200000 IJ SOLN
INTRAMUSCULAR | Status: AC
  Filled 2023-06-20: qty 120

## 2023-06-20 MED ORDER — FENTANYL CITRATE (PF) 100 MCG/2ML IJ SOLN: 25.0000 ug | INTRAMUSCULAR | Status: DC | PRN

## 2023-06-20 MED ORDER — ACETAMINOPHEN 500 MG PO TABS
1000.0000 mg | ORAL_TABLET | ORAL | Status: AC
  Administered 2023-06-20: 1000 mg via ORAL

## 2023-06-20 MED ORDER — CEFAZOLIN SODIUM-DEXTROSE 2-4 GM/100ML-% IV SOLN
INTRAVENOUS | Status: AC
  Filled 2023-06-20: qty 100

## 2023-06-20 MED ORDER — PROPOFOL 10 MG/ML IV BOLUS
INTRAVENOUS | Status: DC | PRN
  Administered 2023-06-20: 150 mg via INTRAVENOUS

## 2023-06-20 MED ORDER — CEFAZOLIN SODIUM-DEXTROSE 2-4 GM/100ML-% IV SOLN: 2.0000 g | INTRAVENOUS | Status: DC

## 2023-06-20 MED ORDER — ENSURE PRE-SURGERY PO LIQD: 296.0000 mL | Freq: Once | ORAL | Status: DC

## 2023-06-20 MED ORDER — DROPERIDOL 2.5 MG/ML IJ SOLN: 0.6250 mg | Freq: Once | INTRAMUSCULAR | Status: DC | PRN

## 2023-06-20 MED ORDER — FENTANYL CITRATE (PF) 100 MCG/2ML IJ SOLN
INTRAMUSCULAR | Status: AC
  Filled 2023-06-20: qty 2

## 2023-06-20 MED ORDER — DEXAMETHASONE SODIUM PHOSPHATE 4 MG/ML IJ SOLN
INTRAMUSCULAR | Status: DC | PRN
  Administered 2023-06-20: 5 mg via INTRAVENOUS

## 2023-06-20 MED ORDER — BUPIVACAINE HCL (PF) 0.25 % IJ SOLN
INTRAMUSCULAR | Status: DC | PRN
  Administered 2023-06-20: 10 mL

## 2023-06-20 MED ORDER — EPHEDRINE SULFATE (PRESSORS) 50 MG/ML IJ SOLN
INTRAMUSCULAR | Status: DC | PRN
  Administered 2023-06-20 (×4): 5 mg via INTRAVENOUS

## 2023-06-20 MED ORDER — MAGTRACE LYMPHATIC TRACER
INTRAMUSCULAR | Status: DC | PRN
  Administered 2023-06-20: 1.5 mL via INTRAMUSCULAR

## 2023-06-20 SURGICAL SUPPLY — 56 items
ADH SKN CLS APL DERMABOND .7 (GAUZE/BANDAGES/DRESSINGS) ×1
APL PRP STRL LF DISP 70% ISPRP (MISCELLANEOUS) ×1
APPLIER CLIP 9.375 MED OPEN (MISCELLANEOUS)
APR CLP MED 9.3 20 MLT OPN (MISCELLANEOUS)
BINDER BREAST LRG (GAUZE/BANDAGES/DRESSINGS) IMPLANT
BINDER BREAST MEDIUM (GAUZE/BANDAGES/DRESSINGS) IMPLANT
BINDER BREAST XLRG (GAUZE/BANDAGES/DRESSINGS) IMPLANT
BINDER BREAST XXLRG (GAUZE/BANDAGES/DRESSINGS) IMPLANT
BLADE SURG 15 STRL LF DISP TIS (BLADE) ×1 IMPLANT
BLADE SURG 15 STRL SS (BLADE) ×1
CANISTER SUC SOCK COL 7IN (MISCELLANEOUS) IMPLANT
CANISTER SUCT 1200ML W/VALVE (MISCELLANEOUS) IMPLANT
CHLORAPREP W/TINT 26 (MISCELLANEOUS) ×1 IMPLANT
CLIP APPLIE 9.375 MED OPEN (MISCELLANEOUS) IMPLANT
CLIP TI WIDE RED SMALL 6 (CLIP) IMPLANT
COVER BACK TABLE 60X90IN (DRAPES) ×1 IMPLANT
COVER MAYO STAND STRL (DRAPES) ×1 IMPLANT
COVER PROBE CYLINDRICAL 5X96 (MISCELLANEOUS) ×1 IMPLANT
DERMABOND ADVANCED .7 DNX12 (GAUZE/BANDAGES/DRESSINGS) ×1 IMPLANT
DRAPE LAPAROSCOPIC ABDOMINAL (DRAPES) ×1 IMPLANT
DRAPE UTILITY XL STRL (DRAPES) ×1 IMPLANT
DRSG TEGADERM 4X4.75 (GAUZE/BANDAGES/DRESSINGS) IMPLANT
ELECT COATED BLADE 2.86 ST (ELECTRODE) ×1 IMPLANT
ELECT REM PT RETURN 9FT ADLT (ELECTROSURGICAL) ×1
ELECTRODE REM PT RTRN 9FT ADLT (ELECTROSURGICAL) ×1 IMPLANT
GAUZE SPONGE 4X4 12PLY STRL LF (GAUZE/BANDAGES/DRESSINGS) IMPLANT
GLOVE BIO SURGEON STRL SZ7 (GLOVE) ×2 IMPLANT
GLOVE BIOGEL PI IND STRL 7.5 (GLOVE) ×1 IMPLANT
GOWN STRL REUS W/ TWL LRG LVL3 (GOWN DISPOSABLE) ×2 IMPLANT
GOWN STRL REUS W/TWL LRG LVL3 (GOWN DISPOSABLE) ×2
HEMOSTAT ARISTA ABSORB 3G PWDR (HEMOSTASIS) IMPLANT
KIT MARKER MARGIN INK (KITS) ×1 IMPLANT
NDL HYPO 25X1 1.5 SAFETY (NEEDLE) ×1 IMPLANT
NEEDLE HYPO 25X1 1.5 SAFETY (NEEDLE) ×1
NS IRRIG 1000ML POUR BTL (IV SOLUTION) IMPLANT
PACK BASIN DAY SURGERY FS (CUSTOM PROCEDURE TRAY) ×1 IMPLANT
PENCIL SMOKE EVACUATOR (MISCELLANEOUS) ×1 IMPLANT
RETRACTOR ONETRAX LX 90X20 (MISCELLANEOUS) IMPLANT
SLEEVE SCD COMPRESS KNEE MED (STOCKING) ×1 IMPLANT
SPIKE FLUID TRANSFER (MISCELLANEOUS) IMPLANT
SPONGE T-LAP 4X18 ~~LOC~~+RFID (SPONGE) ×1 IMPLANT
STRIP CLOSURE SKIN 1/2X4 (GAUZE/BANDAGES/DRESSINGS) ×1 IMPLANT
SUT MNCRL AB 4-0 PS2 18 (SUTURE) ×1 IMPLANT
SUT MON AB 5-0 PS2 18 (SUTURE) IMPLANT
SUT SILK 2 0 SH (SUTURE) IMPLANT
SUT VIC AB 2-0 SH 27 (SUTURE) ×1
SUT VIC AB 2-0 SH 27XBRD (SUTURE) ×1 IMPLANT
SUT VIC AB 3-0 SH 27 (SUTURE) ×1
SUT VIC AB 3-0 SH 27X BRD (SUTURE) ×1 IMPLANT
SUT VIC AB 5-0 PS2 18 (SUTURE) IMPLANT
SYR CONTROL 10ML LL (SYRINGE) ×1 IMPLANT
TOWEL GREEN STERILE FF (TOWEL DISPOSABLE) ×1 IMPLANT
TRACER MAGTRACE VIAL (MISCELLANEOUS) IMPLANT
TRAY FAXITRON CT DISP (TRAY / TRAY PROCEDURE) ×1 IMPLANT
TUBE CONNECTING 20X1/4 (TUBING) IMPLANT
YANKAUER SUCT BULB TIP NO VENT (SUCTIONS) IMPLANT

## 2023-06-20 NOTE — Anesthesia Postprocedure Evaluation (Signed)
Anesthesia Post Note  Patient: Erica Perkins  Procedure(s) Performed: LEFT BREAST SEED BRACKETED LUMPECTOMY (Left: Breast)     Patient location during evaluation: PACU Anesthesia Type: General Level of consciousness: awake and alert Pain management: pain level controlled Vital Signs Assessment: post-procedure vital signs reviewed and stable Respiratory status: spontaneous breathing, nonlabored ventilation, respiratory function stable and patient connected to nasal cannula oxygen Cardiovascular status: blood pressure returned to baseline and stable Postop Assessment: no apparent nausea or vomiting Anesthetic complications: no   No notable events documented.  Last Vitals:  Vitals:   06/20/23 0845 06/20/23 0904  BP: (!) 141/72 (!) 152/80  Pulse: 89 71  Resp: 14 16  Temp:  (!) 36.2 C  SpO2: 100% 97%    Last Pain:  Vitals:   06/20/23 0904  TempSrc: Temporal  PainSc: 0-No pain                 Earl Lites P Marquon Alcala

## 2023-06-20 NOTE — Anesthesia Procedure Notes (Signed)
Procedure Name: LMA Insertion Date/Time: 06/20/2023 7:42 AM  Performed by: Earmon Phoenix, CRNAPre-anesthesia Checklist: Patient identified, Emergency Drugs available, Suction available, Patient being monitored and Timeout performed Patient Re-evaluated:Patient Re-evaluated prior to induction Oxygen Delivery Method: Circle system utilized Preoxygenation: Pre-oxygenation with 100% oxygen Induction Type: IV induction Ventilation: Mask ventilation without difficulty LMA: LMA inserted LMA Size: 4.0 Number of attempts: 1 Placement Confirmation: positive ETCO2 and breath sounds checked- equal and bilateral Tube secured with: Tape Dental Injury: Teeth and Oropharynx as per pre-operative assessment

## 2023-06-20 NOTE — Op Note (Signed)
Preoperative diagnosis: Left breast DCIS Postoperative diagnosis: Same as above Procedure: Left breast seed bracketed lumpectomy Surgeon: Dr. Harden Mo Anesthesia: General Estimated blood loss: Minimal Complications: None Drains: Specimens: 1.  Left breast tissue containing clip and seeds marked with paint 2.  Additional inferior and lateral margins marked short superior, long lateral, double deep Sponge and needle count was correct at completion Disposition recovery stable addition   Indications: 76 yof who is otherwise healthy presents after screening mm that shows left breast calcs. She has family history in her sister in 54s and maternal aunt. Laterally there is area of 1.4 cm calcs and near that another 8 mm area of calcs. The 1.4 cm area has been biopsied and is hg dcis that is er/pr negative. We discussed a seed bracketed lumpectomy.    Procedure: After informed consent was obtained she was taken to the operating room.  She was given Ancef.  SCDs were in place.  She was placed under general anesthesia without complication.  She was prepped and draped in the standard sterile surgical fashion.  Surgical timeout was then performed.   I located the seed in very uoq.  I infiltrated marcaine throughout this area and made a curvilinear incision high on the breast.  I then used the neoprobe to remove the seeds and the surrounding tissue with an attempt to get a clear margin.  I thought all my margins were clear but on imaging I thought lateral and inferior could be close so I removed these and marked as above. Hemostasis was observed.  I then closed the cavity with 2-0 Vicryl and the skin was closed with 3-0 Vicryl and 4-0 Monocryl.  Glue and Steri-Strips were applied.  She tolerated this well was extubated and transferred recovery stable.

## 2023-06-20 NOTE — Transfer of Care (Signed)
Immediate Anesthesia Transfer of Care Note  Patient: Erica Perkins  Procedure(s) Performed: LEFT BREAST SEED BRACKETED LUMPECTOMY (Left: Breast)  Patient Location: PACU  Anesthesia Type:General  Level of Consciousness: drowsy and patient cooperative  Airway & Oxygen Therapy: Patient Spontanous Breathing and Patient connected to face mask oxygen  Post-op Assessment: Report given to RN and Post -op Vital signs reviewed and stable  Post vital signs: Reviewed and stable  Last Vitals:  Vitals Value Taken Time  BP    Temp    Pulse 98 06/20/23 0837  Resp 21 06/20/23 0837  SpO2 100 % 06/20/23 0837  Vitals shown include unfiled device data.  Last Pain:  Vitals:   06/20/23 0627  TempSrc: Temporal  PainSc: 0-No pain      Patients Stated Pain Goal: 2 (06/20/23 1610)  Complications: No notable events documented.

## 2023-06-20 NOTE — Anesthesia Preprocedure Evaluation (Addendum)
Anesthesia Evaluation  Patient identified by MRN, date of birth, ID band Patient awake    Airway Mallampati: II  TM Distance: >3 FB Neck ROM: Full    Dental no notable dental hx.    Pulmonary neg pulmonary ROS   Pulmonary exam normal        Cardiovascular negative cardio ROS  Rhythm:Regular Rate:Normal     Neuro/Psych negative neurological ROS  negative psych ROS   GI/Hepatic negative GI ROS, Neg liver ROS,,,  Endo/Other  negative endocrine ROS    Renal/GU negative Renal ROS  negative genitourinary   Musculoskeletal Left breast ductal Ca   Abdominal Normal abdominal exam  (+)   Peds  Hematology Lab Results      Component                Value               Date                      WBC                      4.9                 05/31/2023                HGB                      11.9 (L)            05/31/2023                HCT                      38.2                05/31/2023                MCV                      87.2                05/31/2023                PLT                      223                 05/31/2023              Anesthesia Other Findings   Reproductive/Obstetrics                             Anesthesia Physical Anesthesia Plan  ASA: 2  Anesthesia Plan: General   Post-op Pain Management: Tylenol PO (pre-op)*   Induction: Intravenous  PONV Risk Score and Plan: 3 and Ondansetron, Dexamethasone and Treatment may vary due to age or medical condition  Airway Management Planned: Mask and LMA  Additional Equipment: None  Intra-op Plan:   Post-operative Plan: Extubation in OR  Informed Consent: I have reviewed the patients History and Physical, chart, labs and discussed the procedure including the risks, benefits and alternatives for the proposed anesthesia with the patient or authorized representative who has indicated his/her understanding and acceptance.     Dental  advisory given  Plan Discussed with: CRNA  Anesthesia Plan  Comments:        Anesthesia Quick Evaluation

## 2023-06-20 NOTE — H&P (Signed)
70 yof who is otherwise healthy presents after screening mm that shows left breast calcs. She has family history in her sister in 27s and maternal aunt. Laterally there is area of 1.4 cm calcs and near that another 8 mm area of calcs. The 1.4 cm area has been biopsied and is hg dcis that is er/pr negative. She has no nipple dc. She is here to discuss options  Review of Systems: A complete review of systems was obtained from the patient. I have reviewed this information and discussed as appropriate with the patient. See HPI as well for other ROS.  Review of Systems  All other systems reviewed and are negative.  Medical History: History reviewed. No pertinent past medical history.  Patient Active Problem List  Diagnosis  Hypercholesterolemia  Periodontal disease  Ductal carcinoma in situ (DCIS) of left breast   Past Surgical History:  Procedure Laterality Date  TUBAL LIGATION   No Known Allergies  Current Outpatient Medications on File Prior to Visit  Medication Sig Dispense Refill  calcium carbonate-vitamin D3 (OS-CAL 500+D) 500 mg-10 mcg (400 unit) tablet Take 1 tablet by mouth once daily  cholecalciferol (VITAMIN D3) 2,000 unit tablet Take 2,000 Units by mouth once daily   Family History  Problem Relation Age of Onset  Breast cancer Sister    Social History   Tobacco Use  Smoking Status Never  Smokeless Tobacco Never  Marital status: Divorced  Tobacco Use  Smoking status: Never  Smokeless tobacco: Never  Vaping Use  Vaping status: Never Used  Substance and Sexual Activity  Alcohol use: No  Drug use: No  Sexual activity: Not Currently  Birth control/protection: None, Post-menopausal    Objective:   Physical Exam Vitals reviewed.  Constitutional:  Appearance: Normal appearance.  Chest:  Breasts: Right: No inverted nipple, mass or nipple discharge.  Left: No inverted nipple, mass or nipple discharge.  Lymphadenopathy:  Upper Body:  Right upper body: No  supraclavicular or axillary adenopathy.  Left upper body: No supraclavicular or axillary adenopathy.  Neurological:  Mental Status: She is alert.    Assessment and Plan:    Ductal carcinoma in situ (DCIS) of left breast  Left breast seed guided lumpectomy (two seeds)  We discussed the staging and pathophysiology of breast cancer. We discussed all of the different options for treatment for breast cancer including surgery, radiation therapy, and antiestrogen therapy.  She does not need a sn biopsy but I am going to inject magtrace just in case needs a delayed sn biopsy.  We discussed the options for treatment of the breast cancer which included lumpectomy versus a mastectomy. We discussed the performance of the lumpectomy with radioactive seed placement. We discussed biopsying the other calcs but I think they just needs to be removed and she would like to avoid this. We discussed a 5-10% chance of a positive margin requiring reexcision in the operating room. We also discussed that she will likely need radiation therapy if she undergoes lumpectomy. We discussed mastectomy and the postoperative care for that as well. Mastectomy can be followed by reconstruction. Most mastectomy patients will not need radiation therapy. We discussed that there is no difference in her survival whether she undergoes lumpectomy with radiation therapy or antiestrogen therapy versus a mastectomy. There is also no real difference between her recurrence in the breast.  We discussed the risks of operation including bleeding, infection, possible reoperation. She understands her further therapy will be based on what her stages at the time of  her operation.

## 2023-06-20 NOTE — Interval H&P Note (Signed)
History and Physical Interval Note:  06/20/2023 7:19 AM  Erica Perkins  has presented today for surgery, with the diagnosis of LEFT BREAST DUCTAL CARCINOMA IN SITU.  The various methods of treatment have been discussed with the patient and family. After consideration of risks, benefits and other options for treatment, the patient has consented to  Procedure(s): LEFT BREAST SEED BRACKETED LUMPECTOMY (Left) as a surgical intervention.  The patient's history has been reviewed, patient examined, no change in status, stable for surgery.  I have reviewed the patient's chart and labs.  Questions were answered to the patient's satisfaction.     Emelia Loron

## 2023-06-20 NOTE — Discharge Instructions (Addendum)
[facility] Office Phone Number [phone]  POST OP INSTRUCTIONS Take 400 mg of ibuprofen every 8 hours or 650 mg tylenol every 6 hours for next 72 hours then as needed. Use ice several times daily also.  A prescription for pain medication may be given to you upon discharge.  Take your pain medication as prescribed, if needed.  If narcotic pain medicine is not needed, then you may take acetaminophen (Tylenol), naprosyn (Alleve) or ibuprofen (Advil) as needed. Take your usually prescribed medications unless otherwise directed If you need a refill on your pain medication, please contact your pharmacy.  They will contact our office to request authorization.  Prescriptions will not be filled after 5pm or on week-ends. You should eat very light the first 24 hours after surgery, such as soup, crackers, pudding, etc.  Resume your normal diet the day after surgery. Most patients will experience some swelling and bruising in the breast.  Ice packs and a good support bra will help.  Wear the breast binder provided or a sports bra for 72 hours day and night.  After that wear a sports bra during the day until you return to the office. Swelling and bruising can take several days to resolve.  It is common to experience some constipation if taking pain medication after surgery.  Increasing fluid intake and taking a stool softener will usually help or prevent this problem from occurring.  A mild laxative (Milk of Magnesia or Miralax) should be taken according to package directions if there are no bowel movements after 48 hours. I used skin glue on the incision, you may shower in 24 hours.  The glue will flake off over the next 2-3 weeks.  Any sutures or staples will be removed at the office during your follow-up visit. ACTIVITIES:  You may resume regular daily activities (gradually increasing) beginning the next day.  Wearing a good support bra or sports bra minimizes pain and swelling.  You may have  sexual intercourse when it is comfortable. You may drive when you no longer are taking prescription pain medication, you can comfortably wear a seatbelt, and you can safely maneuver your car and apply brakes. RETURN TO WORK:  ______________________________________________________________________________________ Bonita Quin should see your doctor in the office for a follow-up appointment approximately two weeks after your surgery.  Your doctor's nurse will typically make your follow-up appointment when she calls you with your pathology report.  Expect your pathology report 3-4 business days after your surgery.  You may call to check if you do not hear from [place] after three days. OTHER INSTRUCTIONS: _______________________________________________________________________________________________ _____________________________________________________________________________________________________________________________________ _____________________________________________________________________________________________________________________________________ _____________________________________________________________________________________________________________________________________  WHEN TO CALL DR WAKEFIELD: Fever over 101.0 Nausea and/or vomiting. Extreme swelling or bruising. Continued bleeding from incision. Increased pain, redness, or drainage from the incision.  The clinic staff is available to answer your questions during regular business hours.  Please don't hesitate to call and ask to speak to one of the nurses for clinical concerns.  If you have a medical emergency, go to the nearest emergency room or call 911.  A surgeon from [facility] is always on call at the hospital.  For further questions, please visit [email or web address]  No Tylenol until after 12:30pm today if needed   Post Anesthesia Home Care Instructions  Activity: Get plenty of rest for the remainder  of the day. A responsible individual must stay with you for 24 hours following the procedure.  For the next 24 hours, DO NOT: -Drive a car Environmental consultant -  Drink alcoholic beverages -Take any medication unless instructed by your physician -Make any legal decisions or sign important papers.  Meals: Start with liquid foods such as gelatin or soup. Progress to regular foods as tolerated. Avoid greasy, spicy, heavy foods. If nausea and/or vomiting occur, drink only clear liquids until the nausea and/or vomiting subsides. Call your physician if vomiting continues.  Special Instructions/Symptoms: Your throat may feel dry or sore from the anesthesia or the breathing tube placed in your throat during surgery. If this causes discomfort, gargle with warm salt water. The discomfort should disappear within 24 hours.  If you had a scopolamine patch placed behind your ear for the management of post- operative nausea and/or vomiting:  1. The medication in the patch is effective for 72 hours, after which it should be removed.  Wrap patch in a tissue and discard in the trash. Wash hands thoroughly with soap and water. 2. You may remove the patch earlier than 72 hours if you experience unpleasant side effects which may include dry mouth, dizziness or visual disturbances. 3. Avoid touching the patch. Wash your hands with soap and water after contact with the patch.

## 2023-06-21 ENCOUNTER — Encounter (HOSPITAL_BASED_OUTPATIENT_CLINIC_OR_DEPARTMENT_OTHER): Payer: Self-pay | Admitting: General Surgery

## 2023-06-23 ENCOUNTER — Encounter: Payer: Self-pay | Admitting: General Practice

## 2023-06-23 NOTE — Progress Notes (Signed)
[facility] Spiritual Care Note  Followed up with [name] by phone. She was in good spirits, citing no pain after surgery and relief that "Dr Dwain Sarna got it all," and very appreciative of call. We plan to follow up by phone around the start of her radiation treatments and may schedule an in-person appointment around that schedule.   [address] [place], [place], [facility] Pager [phone] Voicemail [phone]

## 2023-06-27 ENCOUNTER — Encounter: Payer: Self-pay | Admitting: *Deleted

## 2023-07-07 ENCOUNTER — Inpatient Hospital Stay: Payer: 59 | Attending: Hematology and Oncology | Admitting: Hematology and Oncology

## 2023-07-07 VITALS — BP 141/70 | HR 84 | Temp 97.2°F | Resp 18 | Ht 68.0 in | Wt 139.2 lb

## 2023-07-07 DIAGNOSIS — D0512 Intraductal carcinoma in situ of left breast: Secondary | ICD-10-CM | POA: Diagnosis not present

## 2023-07-07 DIAGNOSIS — Z171 Estrogen receptor negative status [ER-]: Secondary | ICD-10-CM | POA: Diagnosis not present

## 2023-07-07 NOTE — Progress Notes (Signed)
Patient Care Team: Enid Baas, MD as PCP - General (Internal Medicine) Jim Like, RN as Registered Nurse Scarlett Presto, RN (Inactive) as Registered Nurse Serena Croissant, MD as Consulting Physician (Hematology and Oncology) Emelia Loron, MD as Consulting Physician (General Surgery) Antony Blackbird, MD as Consulting Physician (Radiation Oncology)  DIAGNOSIS:  Encounter Diagnosis  Name Primary?   Ductal carcinoma in situ (DCIS) of left breast Yes    SUMMARY OF ONCOLOGIC HISTORY: Oncology History  Ductal carcinoma in situ (DCIS) of left breast  04/25/2023 Initial Diagnosis   Screening mammogram detected left breast calcifications.  2 groups for screw 1.4 cm biopsy high-grade DCIS with calcifications comedonecrosis ER 0%, PR 0%, second group of calcifications 0.8 cm: Not biopsied yet   05/31/2023 Cancer Staging   Staging form: Breast, AJCC 8th Edition - Clinical: Stage 0 (cTis (DCIS), cN0, cM0, G3, ER-, PR-, HER2: Equivocal) - Signed by Serena Croissant, MD on 05/31/2023 Stage prefix: Initial diagnosis Histologic grading system: 3 grade system     CHIEF COMPLIANT: Follow-up after recent breast surgery  History of Present Illness   The patient, with a history of recently diagnosed ductal carcinoma in situ (DCIS), presents for a post-operative follow-up after breast surgery. She reports no pain or discomfort. The pathology report confirmed the diagnosis of DCIS, which was high grade but completely excised with negative margins. The DCIS was not estrogen or progesterone receptor positive. The patient is scheduled for radiation therapy to prevent recurrence.   ALLERGIES:  has No Known Allergies.  MEDICATIONS:  Current Outpatient Medications  Medication Sig Dispense Refill   Calcium Carb-Vit D-C-E-Mineral (OS-CAL ULTRA PO) Take by mouth.     Cholecalciferol (VITAMIN D) 50 MCG (2000 UT) CAPS Take by mouth.     No current facility-administered medications for this  visit.    PHYSICAL EXAMINATION: ECOG PERFORMANCE STATUS: 1 - Symptomatic but completely ambulatory  Vitals:   07/07/23 1027  BP: (!) 141/70  Pulse: 84  Resp: 18  Temp: (!) 97.2 F (36.2 C)  SpO2: 99%   Filed Weights   07/07/23 1027  Weight: 139 lb 3.2 oz (63.1 kg)      LABORATORY DATA:  I have reviewed the data as listed    Latest Ref Rng & Units 05/31/2023   11:34 AM 12/23/2019    7:23 PM  CMP  Glucose 70 - 99 mg/dL 88  416   BUN 8 - 23 mg/dL 14  15   Creatinine 6.06 - 1.00 mg/dL 3.01  6.01   Sodium 093 - 145 mmol/L 141  141   Potassium 3.5 - 5.1 mmol/L 4.2  4.8   Chloride 98 - 111 mmol/L 105  103   CO2 22 - 32 mmol/L 33  30   Calcium 8.9 - 10.3 mg/dL 9.6  9.5   Total Protein 6.5 - 8.1 g/dL 7.8  7.9   Total Bilirubin 0.3 - 1.2 mg/dL 0.4  0.6   Alkaline Phos 38 - 126 U/L 49  61   AST 15 - 41 U/L 14  13   ALT 0 - 44 U/L 14  12     Lab Results  Component Value Date   WBC 4.9 05/31/2023   HGB 11.9 (L) 05/31/2023   HCT 38.2 05/31/2023   MCV 87.2 05/31/2023   PLT 223 05/31/2023   NEUTROABS 2.5 05/31/2023    ASSESSMENT & PLAN:  Ductal carcinoma in situ (DCIS) of left breast Left mastectomy: DCIS high-grade with necrosis 1.1 cm, margins  negative, ER 0%, PR 0% Pathology counseling: I discussed the final pathology report of the patient provided  a copy of this report. I discussed the margins.  We also discussed the final staging along with previously performed ER/PR testing.  Treatment plan: Adjuvant radiation therapy followed by surveillance Return to me in 3 months for survivorship care plan visit and after that she could be seen annually by long-term survivorship.    No orders of the defined types were placed in this encounter.  The patient has a good understanding of the overall plan. she agrees with it. she will call with any problems that may develop before the next visit here. Total time spent: 30 mins including face to face time and time spent for  planning, charting and co-ordination of care   Tamsen Meek, MD 07/07/23

## 2023-07-07 NOTE — Assessment & Plan Note (Signed)
Left mastectomy: DCIS high-grade with necrosis 1.1 cm, margins negative, ER 0%, PR 0% Pathology counseling: I discussed the final pathology report of the patient provided  a copy of this report. I discussed the margins.  We also discussed the final staging along with previously performed ER/PR testing.  Treatment plan: Adjuvant radiation therapy followed by surveillance Return to me in 3 months for survivorship care plan visit and after that she could be seen annually by long-term survivorship.

## 2023-07-11 NOTE — Progress Notes (Incomplete)
Location of Breast Cancer:   Histology per Pathology Report:      Receptor Status: ER(0%), PR (0%),   Did patient present with symptoms (if so, please note symptoms) or was this found on screening mammography?: Screening mammogram on 02/07/23.  Past/Anticipated interventions by surgeon, if any: Dr. Dwain Sarna    Past/Anticipated interventions by medical oncology, if any: Chemotherapy- Dr. Pamelia Hoit 07/07/23     Lymphedema issues, if any:  {:18581} {t:21944}   Pain issues, if any:  {:18581} {PAIN DESCRIPTION:21022940}  SAFETY ISSUES: Prior radiation? {:18581} Pacemaker/ICD? {:18581} Possible current pregnancy?{:18581} Is the patient on methotrexate? {:18581}  Current Complaints / other details:  ***

## 2023-07-12 NOTE — Progress Notes (Signed)
Radiation Oncology         [id number]) [phone] ________________________________  Name: Erica Perkins MRN: [id number]  Date: 07/13/2023  DOB: 1956/04/24  Re-Evaluation Note  CC: Enid Baas, MD  Serena Croissant, MD  No diagnosis found.  Diagnosis: Stage 0 (cTis (DCIS), cN0, cM0) Left Breast, High-grade DCIS, ER- / PR- / Her2 not assessed : s/p lumpectomy   Narrative:  The patient returns today to discuss radiation treatment options. She was seen in the multidisciplinary breast clinic on 05/31/23.   Since her consultation date, she opted to proceed with a left breast lumpectomy without nodal biopsies on 06/20/23 under the care of Dr. Dwain Sarna. Pathology from the procedure revealed: tumor the size of 1.1 cm; histology of high grade DCIS with necrosis; all margins negative for DCIS. Prognostic indicators significant for: estrogen receptor 0% negative and progesterone receptor 0% negative; Her2 not assessed.     Based on her negative ER status, she will not require antiestrogen therapy. She will return to Dr. Pamelia Hoit in approximately 3 months to review her survivorship care plan.   On review of systems, the patient reports ***. She denies *** and any other symptoms.    Allergies:  has No Known Allergies.  Meds: Current Outpatient Medications  Medication Sig Dispense Refill   Calcium Carb-Vit D-C-E-Mineral (OS-CAL ULTRA PO) Take by mouth.     Cholecalciferol (VITAMIN D) 50 MCG (2000 UT) CAPS Take by mouth.     No current facility-administered medications for this encounter.    Physical Findings: The patient is in no acute distress. Patient is alert and oriented.  vitals were not taken for this visit.  No significant changes. Lungs are clear to auscultation bilaterally. Heart has regular rate and rhythm. No palpable cervical, supraclavicular, or axillary adenopathy. Abdomen soft, non-tender, normal bowel sounds. Right Breast: no palpable mass, nipple discharge or bleeding. Left  Breast: ***  Lab Findings: Lab Results  Component Value Date   WBC 4.9 05/31/2023   HGB 11.9 (L) 05/31/2023   HCT 38.2 05/31/2023   MCV 87.2 05/31/2023   PLT 223 05/31/2023    Radiographic Findings: MM Breast Surgical Specimen  Result Date: 06/20/2023 CLINICAL DATA:  Post excision of a left breast lesion following radioactive seed localization. 2 seeds placed. Assess surgical specimen. EXAM: SPECIMEN RADIOGRAPH OF THE LEFT BREAST COMPARISON:  Previous exam(s). FINDINGS: Status post excision of the left breast. Both radioactive seeds well as the ribbon shaped post biopsy marker clip are present, completely intact, and were marked for pathology. IMPRESSION: Specimen radiograph of the left breast. Electronically Signed   By: Amie Portland M.D.   On: 06/20/2023 08:22   MM LT RADIOACTIVE SEED LOC MAMMO GUIDE  Result Date: 06/16/2023 CLINICAL DATA:  67 year old female presenting for radioactive seed localization of 2 sites in the left breast prior to lumpectomy. EXAM: MAMMOGRAPHIC GUIDED RADIOACTIVE SEED LOCALIZATION OF THE LEFT BREAST COMPARISON:  Previous exam(s). FINDINGS: Patient presents for radioactive seed localization prior to left breast lumpectomy. I met with the patient and we discussed the procedure of seed localization including benefits and alternatives. We discussed the high likelihood of a successful procedure. We discussed the risks of the procedure including infection, bleeding, tissue injury and further surgery. We discussed the low dose of radioactivity involved in the procedure. Informed, written consent was given. The usual time-out protocol was performed immediately prior to the procedure. Using mammographic guidance, sterile technique, 1% lidocaine and an I-125 radioactive seed, the ribbon shaped biopsy marking clip in the upper-outer  left breast was localized using a lateral approach. The follow-up mammogram images confirm the seed in the expected location and were marked for  Dr. Dwain Sarna. Follow-up survey of the patient confirms presence of the radioactive seed. Order number of I-125 seed:  [id number]. Total activity:  0.241 millicuries reference Date: 05/10/2023 Using mammographic guidance, sterile technique, 1% lidocaine and an I-125 radioactive seed, the calcifications just posterior to the biopsy marking clip in the upper-outer left breast was localized using a lateral approach. The follow-up mammogram images confirm the seed in the expected location and were marked for Dr. Dwain Sarna. Follow-up survey of the patient confirms presence of the radioactive seed. Order number of I-125 seed:  [id number]. Total activity:  0.252 millicuries reference Date: 05/25/2023 The patient tolerated the procedure well and was released from the [facility]. She was given instructions regarding seed removal. IMPRESSION: Radioactive seed localization left breast x2. No apparent complications. Electronically Signed   By: Frederico Hamman M.D.   On: 06/16/2023 14:25   MM LT RAD SEED EA ADD LESION LOC MAMMO  Result Date: 06/16/2023 CLINICAL DATA:  67 year old female presenting for radioactive seed localization of 2 sites in the left breast prior to lumpectomy. EXAM: MAMMOGRAPHIC GUIDED RADIOACTIVE SEED LOCALIZATION OF THE LEFT BREAST COMPARISON:  Previous exam(s). FINDINGS: Patient presents for radioactive seed localization prior to left breast lumpectomy. I met with the patient and we discussed the procedure of seed localization including benefits and alternatives. We discussed the high likelihood of a successful procedure. We discussed the risks of the procedure including infection, bleeding, tissue injury and further surgery. We discussed the low dose of radioactivity involved in the procedure. Informed, written consent was given. The usual time-out protocol was performed immediately prior to the procedure. Using mammographic guidance, sterile technique, 1% lidocaine and an I-125 radioactive seed,  the ribbon shaped biopsy marking clip in the upper-outer left breast was localized using a lateral approach. The follow-up mammogram images confirm the seed in the expected location and were marked for Dr. Dwain Sarna. Follow-up survey of the patient confirms presence of the radioactive seed. Order number of I-125 seed:  [id number]. Total activity:  0.241 millicuries reference Date: 05/10/2023 Using mammographic guidance, sterile technique, 1% lidocaine and an I-125 radioactive seed, the calcifications just posterior to the biopsy marking clip in the upper-outer left breast was localized using a lateral approach. The follow-up mammogram images confirm the seed in the expected location and were marked for Dr. Dwain Sarna. Follow-up survey of the patient confirms presence of the radioactive seed. Order number of I-125 seed:  [id number]. Total activity:  0.252 millicuries reference Date: 05/25/2023 The patient tolerated the procedure well and was released from the [facility]. She was given instructions regarding seed removal. IMPRESSION: Radioactive seed localization left breast x2. No apparent complications. Electronically Signed   By: Frederico Hamman M.D.   On: 06/16/2023 14:25    Impression:  Stage 0 (cTis (DCIS), cN0, cM0) Left Breast, High-grade DCIS, ER- / PR- / Her2 not assessed : s/p lumpectomy   ***  Plan:  Patient is scheduled for CT simulation {date/later today}. ***  -----------------------------------  Billie Lade, PhD, MD  This document serves as a record of services personally performed by Antony Blackbird, MD. It was created on his behalf by Neena Rhymes, a trained medical scribe. The creation of this record is based on the scribe's personal observations and the provider's statements to them. This document has been checked and approved by the attending provider.

## 2023-07-13 ENCOUNTER — Ambulatory Visit
Admission: RE | Admit: 2023-07-13 | Discharge: 2023-07-13 | Disposition: A | Discharge status: Home / Self Care | Payer: 59 | Source: Ambulatory Visit | Attending: Radiation Oncology | Admitting: Radiation Oncology

## 2023-07-13 ENCOUNTER — Encounter: Payer: Self-pay | Admitting: Radiation Oncology

## 2023-07-13 VITALS — BP 142/63 | HR 65 | Temp 97.3°F | Resp 18 | Ht 68.0 in | Wt 143.5 lb

## 2023-07-13 DIAGNOSIS — D0512 Intraductal carcinoma in situ of left breast: Secondary | ICD-10-CM | POA: Diagnosis present

## 2023-07-13 DIAGNOSIS — Z171 Estrogen receptor negative status [ER-]: Secondary | ICD-10-CM | POA: Diagnosis not present

## 2023-07-14 ENCOUNTER — Encounter: Payer: Self-pay | Admitting: General Practice

## 2023-07-14 NOTE — Progress Notes (Signed)
[facility] Spiritual Care Note  Followed up with Erica Perkins by phone. She describes herself as "doing BEAUTIFULLY!!" She is so pleased and grateful about how her surgery went and what she is hearing about her prognosis moving forward. Her faith and sense of encouragement are very strong.   Erica Perkins welcomes follow-up calls. We plan to speak again after her radiation treatment has started.   [address] [place], [place], [facility] Pager [phone] Voicemail [phone]

## 2023-07-19 ENCOUNTER — Telehealth: Payer: Self-pay | Admitting: Radiology

## 2023-07-19 NOTE — Telephone Encounter (Signed)
I called the patient today to discuss her recent decision to forego radiation treatment. Given that the patient lives an hour away, Dr. Roselind Messier offered her ultra-hypofractionated radiation treatment. Patient decided to opt-out of this treatment due to the fact that her disease is "stage 0". I shared with her that although her disease is considered stage 0, she is still at an increased risk for locoregional recurrence and radiation is recommended to reduce this risk. We discussed the [facility] DCIS nomogram that predicts her probability of recurrence in 10 years is 13% without radiation and 5% with radiation. We recommend starting this treatment within 2 months of surgery, if possible. She expressed understanding of this and states she will call me back if she changes her mind. She was given my number to call if she has any other questions or concerns.     Joyice Faster, PA-C

## 2023-07-20 ENCOUNTER — Encounter: Payer: Self-pay | Admitting: *Deleted

## 2023-07-20 DIAGNOSIS — D0512 Intraductal carcinoma in situ of left breast: Secondary | ICD-10-CM

## 2023-07-27 ENCOUNTER — Ambulatory Visit: Payer: 59 | Admitting: Radiation Oncology

## 2023-08-01 ENCOUNTER — Encounter: Payer: Self-pay | Admitting: General Practice

## 2023-08-01 NOTE — Progress Notes (Signed)
[facility] Spiritual Care Note  Attempted follow-up phone call, leaving voicemail with encouragement to return call.   [address] [place], [place], [facility] Pager [phone] Voicemail [phone]

## 2023-08-03 ENCOUNTER — Ambulatory Visit: Payer: 59

## 2023-08-10 ENCOUNTER — Ambulatory Visit: Payer: 59

## 2023-08-17 ENCOUNTER — Ambulatory Visit: Payer: 59

## 2023-08-24 ENCOUNTER — Ambulatory Visit: Payer: 59

## 2023-10-12 ENCOUNTER — Encounter: Payer: Self-pay | Admitting: *Deleted

## 2023-10-13 ENCOUNTER — Inpatient Hospital Stay: Payer: 59 | Attending: Hematology and Oncology | Admitting: Adult Health

## 2023-10-13 ENCOUNTER — Encounter: Payer: Self-pay | Admitting: *Deleted

## 2023-10-13 DIAGNOSIS — D0512 Intraductal carcinoma in situ of left breast: Secondary | ICD-10-CM | POA: Diagnosis not present

## 2023-10-13 NOTE — Progress Notes (Signed)
 SURVIVORSHIP VISIT:  BRIEF ONCOLOGIC HISTORY:  Oncology History  Ductal carcinoma in situ (DCIS) of left breast  04/25/2023 Initial Diagnosis   Screening mammogram detected left breast calcifications.  2 groups for screw 1.4 cm biopsy high-grade DCIS with calcifications comedonecrosis ER 0%, PR 0%, second group of calcifications 0.8 cm: Not biopsied yet   05/31/2023 Cancer Staging   Staging form: Breast, AJCC 8th Edition - Clinical: Stage 0 (cTis (DCIS), cN0, cM0, G3, ER-, PR-, HER2: Equivocal) - Signed by Odean Potts, MD on 05/31/2023 Stage prefix: Initial diagnosis Histologic grading system: 3 grade system   06/20/2023 Surgery   BREAST, LEFT, LUMPECTOMY:  Ductal carcinoma in situ, comedo-type, nuclear grade high, with necrosis  DCIS greatest dimension: 1.1 cm  Margins: All margins negative for ductal carcinoma in situ      Closest margin: Inferior margin, 0.1 cm  Prognostic markers: ER 0%, negative, PR 0%, negative  Biopsy site changes present  Other findings: Fibroadenomatoid changes   B. BREAST, LEFT ADDITIONAL INFERIOR MARGIN, EXCISION:  - Negative for carcinoma in situ or invasive carcinoma   C. BREAST, LEFT ADDITIONAL LATERAL MARGIN, EXCISION:  - Negative for carcinoma in situ or invasive carcinoma      INTERVAL HISTORY:    Discussed the use of AI scribe software for clinical note transcription with the patient, who gave verbal consent to proceed.  Erica Perkins to review her survivorship care plan detailing her treatment course for breast cancer, as well as monitoring long-term side effects of that treatment, education regarding health maintenance, screening, and overall wellness and health promotion.     Overall, Erica Perkins reports feeling quite well.  She tells me that she is healing quite well from the surgery and radiation.  She denies any significant concerns today.  REVIEW OF SYSTEMS:  Review of Systems  Constitutional:  Negative for appetite change,  chills, fatigue, fever and unexpected weight change.  HENT:   Negative for hearing loss, lump/mass and trouble swallowing.   Eyes:  Negative for eye problems and icterus.  Respiratory:  Negative for chest tightness, cough and shortness of breath.   Cardiovascular:  Negative for chest pain, leg swelling and palpitations.  Gastrointestinal:  Negative for abdominal distention, abdominal pain, constipation, diarrhea, nausea and vomiting.  Endocrine: Negative for hot flashes.  Genitourinary:  Negative for difficulty urinating.   Musculoskeletal:  Negative for arthralgias.  Skin:  Negative for itching and rash.  Neurological:  Negative for dizziness, extremity weakness, headaches and numbness.  Hematological:  Negative for adenopathy. Does not bruise/bleed easily.  Psychiatric/Behavioral:  Negative for depression. The patient is not nervous/anxious.    Breast: Denies any new nodularity, masses, tenderness, nipple changes, or nipple discharge.       PAST MEDICAL/SURGICAL HISTORY:  Past Medical History:  Diagnosis Date   DCIS (ductal carcinoma in situ)    Hypercholesteremia    Past Surgical History:  Procedure Laterality Date   BREAST BIOPSY Right 01/26/2016   u/s core/ neg FIBROADENOMA   BREAST BIOPSY Left 04/25/2023   Affirm bx, ribbon marker, HIGH-GRADE COMEDO-TYPE DUCTAL CARCINOMA IN SITU (DCIS) WITH ASSOCIATED CALCIFICATIONS   BREAST BIOPSY Left 04/25/2023   MM LT BREAST BX W LOC DEV 1ST LESION IMAGE BX SPEC STEREO GUIDE 04/25/2023 [facility]   BREAST BIOPSY  06/16/2023   MM LT RADIOACTIVE SEED LOC MAMMO GUIDE 06/16/2023 [facility]   BREAST BIOPSY  06/16/2023   MM LT RADIOACTIVE SEED EA ADD LESION LOC MAMMO GUIDE 06/16/2023 [facility]   BREAST LUMPECTOMY  WITH RADIOACTIVE SEED LOCALIZATION Left 06/20/2023   Procedure: LEFT BREAST SEED BRACKETED LUMPECTOMY;  Surgeon: Ebbie Cough, MD;  Location: [facility];  Service: General;  Laterality: Left;    TUBAL LIGATION       ALLERGIES:  No Known Allergies   CURRENT MEDICATIONS:  Outpatient Encounter Medications as of 10/13/2023  Medication Sig   Calcium Carb-Vit D-C-E-Mineral (OS-CAL ULTRA PO) Take by mouth.   Cholecalciferol (VITAMIN D) 50 MCG (2000 UT) CAPS Take by mouth.   No facility-administered encounter medications on file as of 10/13/2023.     ONCOLOGIC FAMILY HISTORY:  Family History  Problem Relation Age of Onset   Breast cancer Sister 59 - 55   Breast cancer Maternal Aunt        dx. unknown age   Colon cancer Neg Hx      SOCIAL HISTORY:  Social History   Socioeconomic History   Marital status: Divorced    Spouse name: Not on file   Number of children: Not on file   Years of education: Not on file   Highest education level: Not on file  Occupational History   Not on file  Tobacco Use   Smoking status: Never   Smokeless tobacco: Never  Vaping Use   Vaping status: Never Used  Substance and Sexual Activity   Alcohol use: Not Currently    Comment: rarely   Drug use: No   Sexual activity: Not on file  Other Topics Concern   Not on file  Social History Narrative   Not on file   Social Drivers of Health   Financial Resource Strain: Low Risk  (05/18/2023)   Received from [facility]   Overall Financial Resource Strain (CARDIA)    Difficulty of Paying Living Expenses: Not hard at all  Food Insecurity: No Food Insecurity (07/13/2023)   Hunger Vital Sign    Worried About Running Out of Food in the Last Year: Never true    Ran Out of Food in the Last Year: Never true  Transportation Needs: No Transportation Needs (07/13/2023)   PRAPARE - Administrator, Civil Service (Medical): No    Lack of Transportation (Non-Medical): No  Physical Activity: Not on file  Stress: Not on file  Social Connections: Not on file  Intimate Partner Violence: Not At Risk (07/13/2023)   Humiliation, Afraid, Rape, and Kick questionnaire    Fear  of Current or Ex-Partner: No    Emotionally Abused: No    Physically Abused: No    Sexually Abused: No     OBSERVATIONS/OBJECTIVE:  Patient sounds well, in no apparent distress.  Mood and behavior are normal. Speech is normal.  Breathing non-labored   LABORATORY DATA:  None for this visit.  DIAGNOSTIC IMAGING:  None for this visit.      ASSESSMENT AND PLAN:  Erica Perkins is a pleasant 68 y.o. female with Stage 0 left breast DCIS, ER+/PR+, diagnosed in 04/2023, treated with lumpectomy.  She presents to the Survivorship Clinic for our initial meeting and routine follow-up post-completion of treatment for breast cancer.    1. Stage 0 left breast cancer:  Erica Perkins is continuing to recover from definitive treatment for breast cancer. She will be seen annually by survivorship alternating with surgery. Her mammogram is due 02/2024; orders placed today.   Today, a comprehensive survivorship care plan and treatment summary was reviewed with the patient today detailing her breast cancer diagnosis, treatment course, potential late/long-term effects of  treatment, appropriate follow-up care with recommendations for the future, and patient education resources.  A copy of this summary, along with a letter will be sent to the patient's primary care provider via mail/fax/In Basket message after today's visit.    2. Bone health:   She was given education on specific activities to promote bone health.  3. Cancer screening:  Due to Erica Perkins's history and her age, she should receive screening for skin cancers, colon cancer, and gynecologic cancers.  The information and recommendations are listed on the patient's comprehensive care plan/treatment summary and were reviewed in detail with the patient.    4. Health maintenance and wellness promotion: Erica Perkins was encouraged to consume 5-7 servings of fruits and vegetables per day. We reviewed the Nutrition Rainbow handout.  She was also  encouraged to engage in moderate to vigorous exercise for 30 minutes per day most days of the week.  She was instructed to limit her alcohol consumption and continue to abstain from tobacco use.     5. Support services/counseling: It is not uncommon for this period of the patient's cancer care trajectory to be one of many emotions and stressors.   She was given information regarding our available services and encouraged to contact me with any questions or for help enrolling in any of our support group/programs.    Follow up instructions:    -Return to cancer center in 02/2024 -Mammogram due in 02/2024 -She is welcome to return back to the Survivorship Clinic at any time; no additional follow-up needed at this time.  -Consider referral back to survivorship as a long-term survivor for continued surveillance  The patient was provided an opportunity to ask questions and all were answered. The patient agreed with the plan and demonstrated an understanding of the instructions.   The patient was advised to call back or seek an in-person evaluation if the symptoms worsen or if the condition fails to improve as anticipated.   I provided 30 minutes of non face-to-face telephone visit time during this encounter, and > 50% was spent counseling as documented under my assessment & plan.   Morna Kendall, NP 10/17/23 12:34 PM Medical Oncology and Hematology [facility] [address] [place], [place] [address] Tel. [phone]    Fax. [phone]  *Total Encounter Time as defined by the Centers for Medicare and Medicaid Services includes, in addition to the face-to-face time of a patient visit (documented in the note above) non-face-to-face time: obtaining and reviewing outside history, ordering and reviewing medications, tests or procedures, care coordination (communications with other health care professionals or caregivers) and documentation in the medical record.

## 2023-10-13 NOTE — Assessment & Plan Note (Addendum)
 Left lumpectomy: DCIS high-grade with necrosis 1.1 cm, margins negative, ER 0%, PR 0%  Treatment plan: Adjuvant radiation therapy followed by surveillance

## 2023-10-17 ENCOUNTER — Encounter: Payer: Self-pay | Admitting: *Deleted

## 2023-10-17 ENCOUNTER — Other Ambulatory Visit: Payer: Self-pay | Admitting: *Deleted

## 2024-01-10 ENCOUNTER — Telehealth: Payer: Self-pay | Admitting: *Deleted

## 2024-01-10 NOTE — Telephone Encounter (Signed)
 Patient called to get phone numbers and to confirm an appointment the location and time as well, gave all information.

## 2024-01-11 ENCOUNTER — Encounter: Payer: Self-pay | Admitting: *Deleted

## 2024-01-11 NOTE — Progress Notes (Signed)
 Pt was in need of a PCP when I talked to her earlier this morning. This navigator has scheduled her with Dr. Geoffery Spruce at [facility] on June10 at 2:00p. Pt has been made aware of appt.

## 2024-03-08 ENCOUNTER — Encounter

## 2024-03-12 ENCOUNTER — Ambulatory Visit: Payer: Self-pay | Admitting: Nurse Practitioner

## 2024-03-29 ENCOUNTER — Encounter

## 2024-04-26 ENCOUNTER — Other Ambulatory Visit: Payer: Self-pay | Admitting: *Deleted

## 2024-04-26 ENCOUNTER — Encounter

## 2024-04-26 ENCOUNTER — Encounter: Payer: Self-pay | Admitting: *Deleted

## 2024-04-26 DIAGNOSIS — D0512 Intraductal carcinoma in situ of left breast: Secondary | ICD-10-CM

## 2024-04-26 NOTE — Progress Notes (Signed)
 Called pt  to give her the steps for requesting her mammo records from [facility] to [facility] and  to getting her appt scheduled w/ [facility]. Pt verbalized understanding.

## 2024-04-26 NOTE — Progress Notes (Signed)
 Pt requesting to transfer breast imaging to [facility] from [facility].  RN successfully faxed order to [facility] per pt request.

## 2024-05-10 ENCOUNTER — Encounter

## 2024-05-30 ENCOUNTER — Encounter: Payer: Self-pay | Admitting: Adult Health
